# Patient Record
Sex: Female | Born: 1950 | Hispanic: No | Marital: Married | Smoking: Current every day smoker
Health system: Southern US, Community
[De-identification: ages and names within clinical notes are randomized; demographics above are authoritative.]

## PROBLEM LIST (undated history)

## (undated) DIAGNOSIS — F32A Depression, unspecified: Secondary | ICD-10-CM

## (undated) DIAGNOSIS — J42 Unspecified chronic bronchitis: Secondary | ICD-10-CM

## (undated) DIAGNOSIS — M1990 Unspecified osteoarthritis, unspecified site: Secondary | ICD-10-CM

## (undated) DIAGNOSIS — T4145XA Adverse effect of unspecified anesthetic, initial encounter: Secondary | ICD-10-CM

## (undated) DIAGNOSIS — Z9981 Dependence on supplemental oxygen: Secondary | ICD-10-CM

## (undated) DIAGNOSIS — F329 Major depressive disorder, single episode, unspecified: Secondary | ICD-10-CM

## (undated) DIAGNOSIS — J449 Chronic obstructive pulmonary disease, unspecified: Secondary | ICD-10-CM

## (undated) DIAGNOSIS — T8859XA Other complications of anesthesia, initial encounter: Secondary | ICD-10-CM

## (undated) DIAGNOSIS — J189 Pneumonia, unspecified organism: Secondary | ICD-10-CM

## (undated) DIAGNOSIS — F419 Anxiety disorder, unspecified: Secondary | ICD-10-CM

## (undated) HISTORY — PX: AUGMENTATION MAMMAPLASTY: SUR837

## (undated) HISTORY — PX: TUBAL LIGATION: SHX77

## (undated) HISTORY — PX: JOINT REPLACEMENT: SHX530

## (undated) HISTORY — PX: TOTAL HIP ARTHROPLASTY: SHX124

## (undated) HISTORY — PX: CATARACT EXTRACTION W/ INTRAOCULAR LENS IMPLANT: SHX1309

---

## 2012-12-04 ENCOUNTER — Emergency Department (HOSPITAL_BASED_OUTPATIENT_CLINIC_OR_DEPARTMENT_OTHER): Payer: Self-pay

## 2012-12-04 ENCOUNTER — Inpatient Hospital Stay (HOSPITAL_BASED_OUTPATIENT_CLINIC_OR_DEPARTMENT_OTHER)
Admission: EM | Admit: 2012-12-04 | Discharge: 2012-12-08 | DRG: 193 | Disposition: A | Discharge status: Home / Self Care | Payer: MEDICAID | Attending: Internal Medicine | Admitting: Internal Medicine

## 2012-12-04 ENCOUNTER — Encounter (HOSPITAL_BASED_OUTPATIENT_CLINIC_OR_DEPARTMENT_OTHER): Payer: Self-pay

## 2012-12-04 DIAGNOSIS — IMO0002 Reserved for concepts with insufficient information to code with codable children: Secondary | ICD-10-CM

## 2012-12-04 DIAGNOSIS — J189 Pneumonia, unspecified organism: Principal | ICD-10-CM

## 2012-12-04 DIAGNOSIS — B954 Other streptococcus as the cause of diseases classified elsewhere: Secondary | ICD-10-CM | POA: Diagnosis present

## 2012-12-04 DIAGNOSIS — E43 Unspecified severe protein-calorie malnutrition: Secondary | ICD-10-CM

## 2012-12-04 DIAGNOSIS — Z720 Tobacco use: Secondary | ICD-10-CM

## 2012-12-04 DIAGNOSIS — J449 Chronic obstructive pulmonary disease, unspecified: Secondary | ICD-10-CM

## 2012-12-04 DIAGNOSIS — F410 Panic disorder [episodic paroxysmal anxiety] without agoraphobia: Secondary | ICD-10-CM

## 2012-12-04 DIAGNOSIS — J4489 Other specified chronic obstructive pulmonary disease: Secondary | ICD-10-CM | POA: Diagnosis present

## 2012-12-04 DIAGNOSIS — F329 Major depressive disorder, single episode, unspecified: Secondary | ICD-10-CM | POA: Diagnosis present

## 2012-12-04 DIAGNOSIS — Z91041 Radiographic dye allergy status: Secondary | ICD-10-CM

## 2012-12-04 DIAGNOSIS — M129 Arthropathy, unspecified: Secondary | ICD-10-CM | POA: Diagnosis present

## 2012-12-04 DIAGNOSIS — F3289 Other specified depressive episodes: Secondary | ICD-10-CM | POA: Diagnosis present

## 2012-12-04 DIAGNOSIS — Z801 Family history of malignant neoplasm of trachea, bronchus and lung: Secondary | ICD-10-CM

## 2012-12-04 DIAGNOSIS — R0902 Hypoxemia: Secondary | ICD-10-CM

## 2012-12-04 DIAGNOSIS — Z681 Body mass index (BMI) 19 or less, adult: Secondary | ICD-10-CM

## 2012-12-04 DIAGNOSIS — F17200 Nicotine dependence, unspecified, uncomplicated: Secondary | ICD-10-CM | POA: Diagnosis present

## 2012-12-04 DIAGNOSIS — F411 Generalized anxiety disorder: Secondary | ICD-10-CM | POA: Diagnosis present

## 2012-12-04 DIAGNOSIS — R7881 Bacteremia: Secondary | ICD-10-CM

## 2012-12-04 DIAGNOSIS — F319 Bipolar disorder, unspecified: Secondary | ICD-10-CM

## 2012-12-04 HISTORY — DX: Chronic obstructive pulmonary disease, unspecified: J44.9

## 2012-12-04 HISTORY — DX: Anxiety disorder, unspecified: F41.9

## 2012-12-04 HISTORY — DX: Depression, unspecified: F32.A

## 2012-12-04 HISTORY — DX: Unspecified osteoarthritis, unspecified site: M19.90

## 2012-12-04 HISTORY — DX: Major depressive disorder, single episode, unspecified: F32.9

## 2012-12-04 LAB — CBC
HCT: 45 % (ref 36.0–46.0)
HCT: 42.1 % (ref 36.0–46.0)
Hemoglobin: 14.3 g/dL (ref 12.0–15.0)
MCH: 32.1 pg (ref 26.0–34.0)
MCHC: 33.1 g/dL (ref 30.0–36.0)
MCHC: 34 g/dL (ref 30.0–36.0)
MCV: 94.6 fL (ref 78.0–100.0)
Platelets: 186 10*3/uL (ref 150–400)
Platelets: 186 K/uL (ref 150–400)
RBC: 4.45 MIL/uL (ref 3.87–5.11)
RDW: 12.2 % (ref 11.5–15.5)
RDW: 12.6 % (ref 11.5–15.5)
WBC: 13.5 K/uL — ABNORMAL HIGH (ref 4.0–10.5)

## 2012-12-04 LAB — CREATININE, SERUM
Creatinine, Ser: 0.64 mg/dL (ref 0.50–1.10)
GFR calc Af Amer: >90 mL/min (ref >90)
GFR calc non Af Amer: >90 mL/min (ref >90)

## 2012-12-04 LAB — BASIC METABOLIC PANEL
BUN: 11 mg/dL (ref 6–23)
Creatinine, Ser: 0.7 mg/dL (ref 0.50–1.10)
GFR calc Af Amer: >90 mL/min (ref >90)
GFR calc non Af Amer: >90 mL/min (ref >90)
Potassium: 3.6 mEq/L (ref 3.5–5.1)

## 2012-12-04 LAB — TROPONIN I: Troponin I: <0.3 ng/mL (ref <0.30)

## 2012-12-04 MED ORDER — SODIUM CHLORIDE 0.9 % IV BOLUS (SEPSIS)
500.0000 mL | Freq: Once | INTRAVENOUS | Status: AC
  Administered 2012-12-04: 500 mL via INTRAVENOUS

## 2012-12-04 MED ORDER — OXYCODONE HCL 5 MG PO TABS
5.0000 mg | ORAL_TABLET | Freq: Four times a day (QID) | ORAL | Status: DC | PRN
  Administered 2012-12-04 – 2012-12-07 (×8): 5 mg via ORAL
  Filled 2012-12-04 (×8): qty 1

## 2012-12-04 MED ORDER — SODIUM CHLORIDE 0.9 % IJ SOLN
3.0000 mL | Freq: Two times a day (BID) | INTRAMUSCULAR | Status: DC
  Administered 2012-12-04 – 2012-12-07 (×7): 3 mL via INTRAVENOUS

## 2012-12-04 MED ORDER — SODIUM CHLORIDE 0.9 % IJ SOLN: 3.0000 mL | INTRAMUSCULAR | Status: DC | PRN

## 2012-12-04 MED ORDER — BUDESONIDE-FORMOTEROL FUMARATE 160-4.5 MCG/ACT IN AERO
2.0000 | INHALATION_SPRAY | Freq: Two times a day (BID) | RESPIRATORY_TRACT | Status: DC
  Administered 2012-12-04 – 2012-12-08 (×8): 2 via RESPIRATORY_TRACT
  Filled 2012-12-04: qty 6

## 2012-12-04 MED ORDER — ENOXAPARIN SODIUM 30 MG/0.3ML ~~LOC~~ SOLN
30.0000 mg | SUBCUTANEOUS | Status: DC
  Administered 2012-12-04 – 2012-12-07 (×4): 30 mg via SUBCUTANEOUS
  Filled 2012-12-04 (×5): qty 0.3

## 2012-12-04 MED ORDER — ASPIRIN 81 MG PO CHEW
324.0000 mg | CHEWABLE_TABLET | Freq: Once | ORAL | Status: AC
  Administered 2012-12-04: 324 mg via ORAL
  Filled 2012-12-04: qty 4

## 2012-12-04 MED ORDER — DEXTROSE 5 % IV SOLN
1.0000 g | INTRAVENOUS | Status: DC
  Administered 2012-12-05 – 2012-12-07 (×3): 1 g via INTRAVENOUS
  Filled 2012-12-04 (×4): qty 10

## 2012-12-04 MED ORDER — SODIUM CHLORIDE 0.9 % IV SOLN: 250.0000 mL | INTRAVENOUS | Status: DC | PRN

## 2012-12-04 MED ORDER — DEXTROSE 5 % IV SOLN
1.0000 g | Freq: Once | INTRAVENOUS | Status: AC
  Administered 2012-12-04: 1 g via INTRAVENOUS
  Filled 2012-12-04: qty 10

## 2012-12-04 MED ORDER — SODIUM CHLORIDE 0.9 % IJ SOLN
3.0000 mL | Freq: Two times a day (BID) | INTRAMUSCULAR | Status: DC
  Administered 2012-12-05: 3 mL via INTRAVENOUS

## 2012-12-04 MED ORDER — TIOTROPIUM BROMIDE MONOHYDRATE 18 MCG IN CAPS
18.0000 ug | ORAL_CAPSULE | Freq: Every day | RESPIRATORY_TRACT | Status: DC
  Administered 2012-12-05: 18 ug via RESPIRATORY_TRACT
  Filled 2012-12-04: qty 5

## 2012-12-04 MED ORDER — GUAIFENESIN 100 MG/5ML PO SYRP
200.0000 mg | ORAL_SOLUTION | ORAL | Status: DC | PRN
  Filled 2012-12-04: qty 10

## 2012-12-04 MED ORDER — LEVALBUTEROL HCL 0.63 MG/3ML IN NEBU
0.6300 mg | INHALATION_SOLUTION | RESPIRATORY_TRACT | Status: DC | PRN
  Filled 2012-12-04: qty 3

## 2012-12-04 MED ORDER — CLONAZEPAM 0.5 MG PO TABS
0.2500 mg | ORAL_TABLET | Freq: Every day | ORAL | Status: DC | PRN
  Administered 2012-12-04: 0.25 mg via ORAL
  Filled 2012-12-04: qty 1

## 2012-12-04 MED ORDER — AZITHROMYCIN 500 MG PO TABS
500.0000 mg | ORAL_TABLET | Freq: Every day | ORAL | Status: DC
  Administered 2012-12-05 – 2012-12-07 (×3): 500 mg via ORAL
  Filled 2012-12-04 (×3): qty 1

## 2012-12-04 MED ORDER — ACETAMINOPHEN 650 MG RE SUPP: 650.0000 mg | Freq: Four times a day (QID) | RECTAL | Status: DC | PRN

## 2012-12-04 MED ORDER — ONDANSETRON HCL 4 MG PO TABS
4.0000 mg | ORAL_TABLET | Freq: Four times a day (QID) | ORAL | Status: DC | PRN
  Administered 2012-12-05 – 2012-12-07 (×6): 4 mg via ORAL
  Filled 2012-12-04 (×6): qty 1

## 2012-12-04 MED ORDER — AZITHROMYCIN 250 MG PO TABS
500.0000 mg | ORAL_TABLET | Freq: Once | ORAL | Status: AC
  Administered 2012-12-04: 500 mg via ORAL
  Filled 2012-12-04: qty 2

## 2012-12-04 MED ORDER — ACETAMINOPHEN 325 MG PO TABS
650.0000 mg | ORAL_TABLET | Freq: Four times a day (QID) | ORAL | Status: DC | PRN
  Administered 2012-12-08: 650 mg via ORAL
  Filled 2012-12-04 (×2): qty 2

## 2012-12-04 MED ORDER — ONDANSETRON HCL 4 MG/2ML IJ SOLN
4.0000 mg | Freq: Four times a day (QID) | INTRAMUSCULAR | Status: DC | PRN
  Administered 2012-12-05 (×2): 4 mg via INTRAVENOUS
  Filled 2012-12-04 (×2): qty 2

## 2012-12-04 MED ORDER — GUAIFENESIN ER 600 MG PO TB12
600.0000 mg | ORAL_TABLET | Freq: Two times a day (BID) | ORAL | Status: DC
  Administered 2012-12-04 – 2012-12-05 (×2): 600 mg via ORAL
  Filled 2012-12-04 (×3): qty 1

## 2012-12-04 MED ORDER — ENOXAPARIN SODIUM 40 MG/0.4ML ~~LOC~~ SOLN: 40.0000 mg | SUBCUTANEOUS | Status: DC

## 2012-12-04 MED ORDER — ASPIRIN EC 81 MG PO TBEC
81.0000 mg | DELAYED_RELEASE_TABLET | Freq: Every day | ORAL | Status: DC
  Administered 2012-12-04 – 2012-12-08 (×5): 81 mg via ORAL
  Filled 2012-12-04 (×5): qty 1

## 2012-12-04 MED ORDER — IPRATROPIUM BROMIDE 0.02 % IN SOLN
0.5000 mg | Freq: Once | RESPIRATORY_TRACT | Status: AC
  Administered 2012-12-04: 0.5 mg via RESPIRATORY_TRACT
  Filled 2012-12-04: qty 2.5

## 2012-12-04 MED ORDER — MORPHINE SULFATE 4 MG/ML IJ SOLN
4.0000 mg | Freq: Once | INTRAMUSCULAR | Status: AC
  Administered 2012-12-04: 4 mg via INTRAVENOUS
  Filled 2012-12-04: qty 1

## 2012-12-04 MED ORDER — ALBUTEROL SULFATE (5 MG/ML) 0.5% IN NEBU
5.0000 mg | INHALATION_SOLUTION | Freq: Once | RESPIRATORY_TRACT | Status: AC
  Administered 2012-12-04: 5 mg via RESPIRATORY_TRACT
  Filled 2012-12-04: qty 0.5

## 2012-12-04 NOTE — Progress Notes (Signed)
NURSING PROGRESS NOTE  CARLETTA FEASEL [id number] Admission Data: 12/04/2012 3:25 PM Attending Provider: Catarina Hartshorn, MD PCP:No primary provider on file. Code Status: full    Erin Lozano is a 62 y.o. female patient admitted from ED:  -No acute distress noted.  -No complaints of shortness of breath.  -No complaints of chest pain.    Blood pressure 103/61, pulse 83, temperature 98.6 F (37 C), temperature source Oral, resp. rate 20, height 5' (1.524 m), weight 39.463 kg (87 lb), SpO2 99.00%.   IV Fluids:  IV in place, occlusive dsg intact without redness, IV cath antecubital right, condition patent and no redness none.   Allergies:  Contrast media  Past Medical History:   has a past medical history of COPD (chronic obstructive pulmonary disease); Arthritis; Anxiety; and Depression.  Past Surgical History:   has past surgical history that includes Breast enhancement surgery.  Social History:   reports that she has been smoking Cigarettes.  She has been smoking about 0.50 packs per day. She does not have any smokeless tobacco history on file. She reports that she does not drink alcohol or use illicit drugs.  Skin: intact  Patient/Family orientated to room. Information packet given to patient/family. Admission inpatient armband information verified with patient/family to include name and date of birth and placed on patient arm. Side rails up x 2, fall assessment and education completed with patient/family. Patient/family able to verbalize understanding of risk associated with falls and verbalized understanding to call for assistance before getting out of bed. Call light within reach. Patient/family able to voice and demonstrate understanding of unit orientation instructions.    Will continue to evaluate and treat per MD orders.   Madelin Rear, MSN, RN, [employer]

## 2012-12-04 NOTE — H&P (Signed)
[facility] History and Physical  Erin Lozano UXL:[id number] DOB: April 26, 1950 DOA: 12/04/2012  Referring physician:  PCP: No primary provider on file.  Specialists:   Chief Complaint: Pleuritic chest pain  HPI: Erin Lozano is a 62 y.o. female with history of COPD who presents with complaints of cough and pleuritic chest pain for the past 2 days. She says that she had had a cough productive of phlegm for about a week and it seemed to be improving, but 2 days ago she began having chest pain. She went to the [facility] regional ED and an EKG done>> according to her report it was negative, and she left without any further workup because she having to  wait for too long. This morning she began having chest pain again, described as pleuritic left precordial and associated with increasing shortness of breath. She denies nausea vomiting and no diaphoresis. She went to the [facility] ED and a chest x-ray showed acute infiltrates in the left upper lobe and chronic changes in the right upper lobe, d-dimer was elevated at 0.87, and white cell count 14.5. Blood cultures were obtained and she was started on empiric antibiotics and [facility]. Troponin done in ED was neg. patient denies fevers, dysuria diarrhea no melena.   Review of Systems: The patient denies anorexia, fever, weight loss,, vision loss, decreased hearing, hoarseness, syncope, peripheral edema, balance deficits, hemoptysis, abdominal pain, melena, hematochezia, severe indigestion/heartburn, hematuria, incontinence, genital sores, muscle weakness transient blindness, difficulty walking, depression, unusual weight change, abnormal bleeding, enlarged lymph nodes   Past Medical History  Diagnosis Date  . COPD (chronic obstructive pulmonary disease)   . Arthritis   . Anxiety   . Depression    Past Surgical History  Procedure Laterality Date  . Breast enhancement surgery     Social History:  reports that she has  been smoking Cigarettes.  She has been smoking about 0.50 packs per day. She does not have any smokeless tobacco history on file. She reports that she does not drink alcohol or use illicit drugs. where does patient live--home Can patient participate in ADLs- yes   Allergies  Allergen Reactions  . Contrast Media [Iodinated Diagnostic Agents]     FAMILY history: Father deceased- with lung cancer, her mother had peritoneal metastatic disease.  Prior to Admission medications   Medication Sig Start Date End Date Taking? Authorizing Provider  budesonide-formoterol (SYMBICORT) 160-4.5 MCG/ACT inhaler Inhale 2 puffs into the lungs 2 (two) times daily.   Yes Historical Provider, MD  ClonazePAM (KLONOPIN PO) Take by mouth.   Yes Historical Provider, MD  levalbuterol (XOPENEX) 0.31 MG/3ML nebulizer solution Take 1 ampule by nebulization every 4 (four) hours as needed for wheezing.   Yes Historical Provider, MD  tiotropium (SPIRIVA) 18 MCG inhalation capsule Place 18 mcg into inhaler and inhale daily.   Yes Historical Provider, MD   Physical Exam: Filed Vitals:   12/04/12 1406  BP: 103/61  Pulse:   Temp:   Resp:     Constitutional: Vital signs reviewed.  Patient is a well-developed and well-nourished in no acute distress and cooperative with exam. Alert and oriented x3.  Head: Normocephalic and atraumatic Nose: No erythema or drainage noted.   Mouth: no erythema or exudates, MMM Eyes: PERRL, EOMI, conjunctivae normal, No scleral icterus.  Neck: Supple, Trachea midline normal ROM, No JVD, mass, thyromegaly, or carotid bruit present.  Cardiovascular: RRR, S1 normal, S2 normal, no MRG, pulses symmetric and intact bilaterally  Pulmonary/Chest: normal respiratory effort,  rhonchi present anteriorly on the left greater than right. Poor air movement bilaterally posteriorly. no wheezes, no rales Abdominal: Soft. Non-tender, non-distended, bowel sounds are normal, no masses, organomegaly, or guarding  present.  Extremities: no cyanosis and no edema Neurological: A&O x3, Strength is normal and symmetric bilaterally, cranial nerve II-XII are grossly intact, no focal motor deficit, sensory intact to light touch bilaterally.  Skin: Warm, dry and intact. No rash, cyanosis, or clubbing.  Psychiatric: Normal mood and affect. speech and behavior is normal. Judgment and thought content normal. Cognition and memory are normal.     Labs on Admission:  Basic Metabolic Panel:  Recent Labs Lab 12/04/12 1110  NA 140  K 3.6  CL 101  CO2 31  GLUCOSE 104*  BUN 11  CREATININE 0.70  CALCIUM 9.2   Liver Function Tests: No results found for this basename: AST, ALT, ALKPHOS, BILITOT, PROT, ALBUMIN,  in the last 168 hours No results found for this basename: LIPASE, AMYLASE,  in the last 168 hours No results found for this basename: AMMONIA,  in the last 168 hours CBC:  Recent Labs Lab 12/04/12 1110  WBC 14.5*  HGB 14.9  HCT 45.0  MCV 95.5  PLT 186   Cardiac Enzymes:  Recent Labs Lab 12/04/12 1110  TROPONINI <0.30    BNP (last 3 results) No results found for this basename: PROBNP,  in the last 8760 hours CBG: No results found for this basename: GLUCAP,  in the last 168 hours  Radiological Exams on Admission: Dg Chest 2 View  12/04/2012   *RADIOLOGY REPORT*  Clinical Data: Cough and shortness of breath  CHEST - 2 VIEW  Comparison: 05/06/2012  Findings: Scarring is again noted in the right upper lobe.  Some acute infiltrate is noted in the left upper lobe laterally.  The lungs are hyperinflated.  Calcified breast implants are noted.  No bony abnormality is seen.  IMPRESSION: Chronic changes in the right upper lobe.  Acute infiltrate is noted in the left upper lobe.   Original Report Authenticated By: Alcide Clever, M.D.      Assessment/Plan Present on Admission:  .CAP -As discussed above will start empiric vancomycin, Rocephin -Add mucolytics/antitussives and follow -Follow up on  blood cultures obtained and the [facility] ED, and further treat as appropriate -D-dimer elevated, we'll obtain a VQ scan to further evaluate(she is allergic to IV contrast)  . COPD (chronic obstructive pulmonary disease) -Continue outpt medications and add when necessary nebs -She's not wheezing, Will hold off steroids at this time and follow     Code Status: full Family Communication: none at bedside Disposition Plan: Admit to [facility]  Time spent: >30MINS  Kela Millin [facility] Pager [phone]  If 7PM-7AM, please contact night-coverage [email or web address] Password [email or web address] 12/04/2012, 3:50 PM

## 2012-12-04 NOTE — Progress Notes (Signed)
Discussed with Dr. Criss Alvine about transfer from [facility]. 62 year old female with three-day history of chest pain, cough, shortness of breath noted to be hypoxic with oxygen saturation of 90% on room air. Workup revealed WBC 14.5 with chest x-ray showing acute left upper lobe infiltrate. D-dimer was mildly elevated at 0.87. The patient had blood pressure 97/75, and the patient was given 500 cc normal saline with improvement of blood pressure 10 111/55.  Cannot perform VQ scan at [facility].  Pt has allergy to contrast dye.  Admit to tele for V/Q scan tx for CAP.  Blood cultures done prior to ceftriaxone and azithro.  DTat

## 2012-12-04 NOTE — ED Notes (Signed)
O2 at 2LNC applied 

## 2012-12-04 NOTE — ED Notes (Signed)
Report called to Derryl Harbor, RN Unit RN.

## 2012-12-04 NOTE — ED Notes (Signed)
Warm blanket provided.

## 2012-12-04 NOTE — ED Notes (Signed)
SHOB improving and pt appears comfortable.

## 2012-12-04 NOTE — ED Notes (Signed)
Pt reports a 2 day history of chest pain, cough, and SHOB.

## 2012-12-04 NOTE — ED Provider Notes (Signed)
CSN: [id number]     Arrival date & time 12/04/12  1036 History     First MD Initiated Contact with Patient 12/04/12 1042     Chief Complaint  Patient presents with  . Cough  . Shortness of Breath  . Chest Pain   (Consider location/radiation/quality/duration/timing/severity/associated sxs/prior Treatment) Patient is a 62 y.o. female presenting with chest pain. The history is provided by the patient.  Chest Pain Pain location:  L chest Pain quality: sharp   Pain radiates to:  Does not radiate Pain radiates to the back: no   Pain severity:  Moderate Onset quality:  Sudden Duration:  3 days Timing:  Constant Progression:  Worsening Chronicity:  New Worsened by:  Deep breathing Associated symptoms: cough (clear sputum) and shortness of breath   Associated symptoms: no back pain, no fever, no lower extremity edema, no nausea and not vomiting     Past Medical History  Diagnosis Date  . COPD (chronic obstructive pulmonary disease)   . Arthritis   . Anxiety   . Depression    Past Surgical History  Procedure Laterality Date  . Breast enhancement surgery     No family history on file. History  Substance Use Topics  . Smoking status: Current Every Day Smoker -- 0.50 packs/day    Types: Cigarettes  . Smokeless tobacco: Not on file  . Alcohol Use: No   OB History   Grav Para Term Preterm Abortions TAB SAB Ect Mult Living                 Review of Systems  Constitutional: Negative for fever and chills.  Respiratory: Positive for cough (clear sputum) and shortness of breath.   Cardiovascular: Positive for chest pain.  Gastrointestinal: Negative for nausea and vomiting.  Musculoskeletal: Negative for back pain.  All other systems reviewed and are negative.    Allergies  Contrast media  Home Medications   Current Outpatient Rx  Name  Route  Sig  Dispense  Refill  . budesonide-formoterol (SYMBICORT) 160-4.5 MCG/ACT inhaler   Inhalation   Inhale 2 puffs into the  lungs 2 (two) times daily.         . ClonazePAM (KLONOPIN PO)   Oral   Take by mouth.         . levalbuterol (XOPENEX) 0.31 MG/3ML nebulizer solution   Nebulization   Take 1 ampule by nebulization every 4 (four) hours as needed for wheezing.         . tiotropium (SPIRIVA) 18 MCG inhalation capsule   Inhalation   Place 18 mcg into inhaler and inhale daily.          BP 97/75  Pulse 97  Temp(Src) 98.1 F (36.7 C) (Oral)  Resp 22  Ht 5' (1.524 m)  Wt 87 lb (39.463 kg)  BMI 16.99 kg/m2  SpO2 92% Physical Exam  Nursing note and vitals reviewed. Constitutional: She is oriented to person, place, and time. She appears well-developed and well-nourished.  HENT:  Head: Normocephalic and atraumatic.  Right Ear: External ear normal.  Left Ear: External ear normal.  Nose: Nose normal.  Eyes: Right eye exhibits no discharge. Left eye exhibits no discharge.  Cardiovascular: Normal rate, regular rhythm and normal heart sounds.   Pulmonary/Chest: Effort normal and breath sounds normal. She has no wheezes. She exhibits tenderness (left anterior and lateral).  Abdominal: Soft. There is no tenderness.  Neurological: She is alert and oriented to person, place, and time.  Skin: Skin is  warm and dry. No rash (no rash, esp over left lateral chest and back) noted.    ED Course   Procedures (including critical care time)  Labs Reviewed  CBC - Abnormal; Notable for the following:    WBC 14.5 (*)    All other components within normal limits  BASIC METABOLIC PANEL - Abnormal; Notable for the following:    Glucose, Bld 104 (*)    All other components within normal limits  D-DIMER, QUANTITATIVE - Abnormal; Notable for the following:    D-Dimer, Quant 0.87 (*)    All other components within normal limits  CULTURE, BLOOD (ROUTINE X 2)  CULTURE, BLOOD (ROUTINE X 2)  TROPONIN I  CG4 I-STAT (LACTIC ACID)    Date: 12/04/2012  Rate: 97  Rhythm: normal sinus rhythm  QRS Axis: right   Intervals: normal  ST/T Wave abnormalities: nonspecific ST/T changes  Conduction Disutrbances:none  Narrative Interpretation: NSR, signs of right atrial enlargement.  Old EKG Reviewed: none available   Dg Chest 2 View  12/04/2012   *RADIOLOGY REPORT*  Clinical Data: Cough and shortness of breath  CHEST - 2 VIEW  Comparison: 05/06/2012  Findings: Scarring is again noted in the right upper lobe.  Some acute infiltrate is noted in the left upper lobe laterally.  The lungs are hyperinflated.  Calcified breast implants are noted.  No bony abnormality is seen.  IMPRESSION: Chronic changes in the right upper lobe.  Acute infiltrate is noted in the left upper lobe.   Original Report Authenticated By: Alcide Clever, M.D.   1. Community acquired pneumonia     MDM  62 year old female with 3 days of chest pain and dyspnea. Both are worse on exertion. She has had a mildly productive cough but is not particularly new. Afebrile. No wheezing auscultated but due to mild increased work of breathing she was given a nebulizer it did not have any effect. X-ray consistent with a new infiltrate, we will treat her for her community acquired pneumonia. Given her borderline oxygenation at 90%, with mildly increased work of breathing and elevated white count we will admit to [facility] with the hospitalist.elevated d-dimer resulted after the x-ray, the pneumonia could be the cause of her pleuritic pain this is on the same side. However we are unable to evaluate further with CT due to patient being allergic to contrast and no VQ scan available at this facility.   Audree Camel, MD 12/04/12 1323

## 2012-12-04 NOTE — ED Notes (Signed)
Report called to Trey Paula, RN [facility]

## 2012-12-05 DIAGNOSIS — R7881 Bacteremia: Secondary | ICD-10-CM | POA: Diagnosis present

## 2012-12-05 LAB — CBC
Hemoglobin: 14.1 g/dL (ref 12.0–15.0)
MCHC: 32.3 g/dL (ref 30.0–36.0)

## 2012-12-05 LAB — STREP PNEUMONIAE URINARY ANTIGEN: Strep Pneumo Urinary Antigen: NEGATIVE

## 2012-12-05 LAB — TROPONIN I
Troponin I: <0.3 ng/mL (ref <0.30)
Troponin I: <0.3 ng/mL (ref <0.30)

## 2012-12-05 MED ORDER — ALBUTEROL SULFATE (5 MG/ML) 0.5% IN NEBU
2.5000 mg | INHALATION_SOLUTION | Freq: Three times a day (TID) | RESPIRATORY_TRACT | Status: DC
  Administered 2012-12-06 – 2012-12-08 (×7): 2.5 mg via RESPIRATORY_TRACT
  Filled 2012-12-05 (×7): qty 0.5

## 2012-12-05 MED ORDER — GUAIFENESIN ER 600 MG PO TB12
1200.0000 mg | ORAL_TABLET | Freq: Two times a day (BID) | ORAL | Status: DC
  Administered 2012-12-06 – 2012-12-07 (×2): 1200 mg via ORAL
  Filled 2012-12-05 (×6): qty 2

## 2012-12-05 MED ORDER — ALBUTEROL SULFATE (5 MG/ML) 0.5% IN NEBU: 2.5000 mg | INHALATION_SOLUTION | Freq: Four times a day (QID) | RESPIRATORY_TRACT | Status: DC

## 2012-12-05 MED ORDER — ALBUTEROL SULFATE (5 MG/ML) 0.5% IN NEBU
2.5000 mg | INHALATION_SOLUTION | Freq: Four times a day (QID) | RESPIRATORY_TRACT | Status: DC
  Administered 2012-12-05 (×2): 2.5 mg via RESPIRATORY_TRACT
  Filled 2012-12-05 (×2): qty 0.5

## 2012-12-05 MED ORDER — TROLAMINE SALICYLATE 10 % EX CREA
TOPICAL_CREAM | Freq: Two times a day (BID) | CUTANEOUS | Status: DC | PRN
  Filled 2012-12-05: qty 85

## 2012-12-05 MED ORDER — IPRATROPIUM BROMIDE 0.02 % IN SOLN: 0.5000 mg | Freq: Three times a day (TID) | RESPIRATORY_TRACT | Status: DC

## 2012-12-05 MED ORDER — IPRATROPIUM BROMIDE 0.02 % IN SOLN
0.5000 mg | Freq: Three times a day (TID) | RESPIRATORY_TRACT | Status: DC
  Administered 2012-12-06 – 2012-12-08 (×7): 0.5 mg via RESPIRATORY_TRACT
  Filled 2012-12-05 (×6): qty 2.5

## 2012-12-05 MED ORDER — IPRATROPIUM BROMIDE 0.02 % IN SOLN
0.5000 mg | Freq: Four times a day (QID) | RESPIRATORY_TRACT | Status: DC
  Administered 2012-12-05 (×2): 0.5 mg via RESPIRATORY_TRACT
  Filled 2012-12-05 (×4): qty 2.5

## 2012-12-05 MED ORDER — HYDROCOD POLST-CHLORPHEN POLST 10-8 MG/5ML PO LQCR
5.0000 mL | Freq: Every evening | ORAL | Status: DC | PRN
  Administered 2012-12-06 – 2012-12-07 (×2): 5 mL via ORAL
  Filled 2012-12-05 (×2): qty 5

## 2012-12-05 MED ORDER — MUSCLE RUB 10-15 % EX CREA
TOPICAL_CREAM | Freq: Two times a day (BID) | CUTANEOUS | Status: DC | PRN
  Administered 2012-12-06: 18:00:00 via TOPICAL
  Filled 2012-12-05: qty 85

## 2012-12-05 NOTE — Progress Notes (Signed)
[facility] PROGRESS NOTE  VIENNA FOLDEN GMW:[id number] DOB: [birth year] DOA: 12/04/2012 PCP: No primary provider on file. Pulmonologist:  Lysle Pearl, PA-C at [facility]   Assessment/Plan:  Community Acquired Pneumonia Leukocytosis increasing 8/21 (not on steroids) Started on Azith and Rocephin at admission Mucolytics, nebulizers. Paroxysmal non productive cough Gram + organism in 1 or 2 blood cultures will follow for sensitivites Strep pneumo antigen is negatve V/Q was ordered for elevated D-Dimer, but subsequently cancelled.  Possible Bacteremia Gram + organism in 1 or 2 blood cultures will follow for sensitivites  COPD Patient not on oxygen at home but is on 2L currently Will wean oxygen. Symbicort and Spiriva continued. Added scheduled nebs temporarily as patient is not moving air well Flutter valve.  DVT Prophylaxis:  Lovenox  Code Status: full Family Communication:  Disposition Plan: to home when able.   Antibiotics:  Rocephin / Azith  HPI/Subjective: Describes 4 deaths in the last 12 months in her family.  States she feels better than she did at the time of admission.  Objective: Filed Vitals:   12/04/12 1352 12/04/12 1406 12/04/12 2107 12/05/12 0532  BP: 94/48 103/61 116/70 119/67  Pulse: 83  100 87  Temp: 98.6 F (37 C)  98.2 F (36.8 C) 98.3 F (36.8 C)  TempSrc: Oral  Oral Oral  Resp: 20  18 18   Height:      Weight:      SpO2: 99%  98% 98%    Intake/Output Summary (Last 24 hours) at 12/05/12 1229 Last data filed at 12/05/12 1038  Gross per 24 hour  Intake    720 ml  Output    100 ml  Net    620 ml   Filed Weights   12/04/12 1053  Weight: 39.463 kg (87 lb)    Exam:   General:  thin, A&O, talkative, Has occasional coughing spell.  Cardiovascular: RRR, no murmurs, rubs or gallops, no lower extremity edema  Respiratory: Decreased breath sounds.  No wheeze, no accessory muscle movment.  Abdomen: thin, Soft,  non-tender, non-distended, + bowel sounds, no masses  Musculoskeletal: Able to move all 4 extremities, 5/5 strength in each  Data Reviewed: Basic Metabolic Panel:  Recent Labs Lab 12/04/12 1110 12/04/12 1821  NA 140  --   K 3.6  --   CL 101  --   CO2 31  --   GLUCOSE 104*  --   BUN 11  --   CREATININE 0.70 0.64  CALCIUM 9.2  --    CBC:  Recent Labs Lab 12/04/12 1110 12/04/12 1821 12/05/12 0526  WBC 14.5* 13.5* 16.2*  HGB 14.9 14.3 14.1  HCT 45.0 42.1 43.6  MCV 95.5 94.6 96.0  PLT 186 186 186   Cardiac Enzymes:  Recent Labs Lab 12/04/12 1110 12/04/12 1821 12/05/12 0004 12/05/12 0526  TROPONINI <0.30 <0.30 <0.30 <0.30     Recent Results (from the past 240 hour(s))  CULTURE, BLOOD (ROUTINE X 2)     Status: None   Collection Time    12/04/12 12:40 PM      Result Value Range Status   Specimen Description BLOOD L AC   Final   Special Requests BOTTLES DRAWN AEROBIC AND ANAEROBIC 5CC   Final   Culture  Setup Time     Final   Value: 12/04/2012 14:08     Performed at [facility]   Culture     Final   Value: GRAM POSITIVE ORGANISM     Note:  Gram Stain Report Called to,Read Back By and Verified With: Steffanie Dunn @ [phone] 12/05/12 BY KRAWS     Performed at [facility]   Report Status PENDING   Incomplete  CULTURE, BLOOD (ROUTINE X 2)     Status: None   Collection Time    12/04/12 12:45 PM      Result Value Range Status   Specimen Description BLOOD R FOREARM   Final   Special Requests BOTTLES DRAWN AEROBIC AND ANAEROBIC 5CC   Final   Culture  Setup Time     Final   Value: 12/04/2012 14:08     Performed at [facility]   Culture     Final   Value:        BLOOD CULTURE RECEIVED NO GROWTH TO DATE CULTURE WILL BE HELD FOR 5 DAYS BEFORE ISSUING A FINAL NEGATIVE REPORT     Performed at [facility]   Report Status PENDING   Incomplete     Studies: Dg Chest 2 View  12/04/2012   *RADIOLOGY REPORT*  Clinical Data: Cough and  shortness of breath  CHEST - 2 VIEW  Comparison: 05/06/2012  Findings: Scarring is again noted in the right upper lobe.  Some acute infiltrate is noted in the left upper lobe laterally.  The lungs are hyperinflated.  Calcified breast implants are noted.  No bony abnormality is seen.  IMPRESSION: Chronic changes in the right upper lobe.  Acute infiltrate is noted in the left upper lobe.   Original Report Authenticated By: Alcide Clever, M.D.    Scheduled Meds: . ipratropium  0.5 mg Nebulization Q6H   And  . albuterol  2.5 mg Nebulization Q6H  . aspirin EC  81 mg Oral Daily  . azithromycin  500 mg Oral Daily  . budesonide-formoterol  2 puff Inhalation BID  . cefTRIAXone (ROCEPHIN)  IV  1 g Intravenous Q24H  . enoxaparin (LOVENOX) injection  30 mg Subcutaneous Q24H  . guaiFENesin  1,200 mg Oral BID  . sodium chloride  3 mL Intravenous Q12H  . sodium chloride  3 mL Intravenous Q12H  . tiotropium  18 mcg Inhalation Daily   Continuous Infusions:   Active Problems:   Community acquired pneumonia   COPD (chronic obstructive pulmonary disease)   Bacteremia    Conley Canal  [facility] Pager [phone]. If 7PM-7AM, please contact night-coverage at [email or web address], password [facility] 12/05/2012, 12:29 PM  LOS: 1 day   Attending note:  Patient interviewed and examined. Agree with above.  Clinically quite stable. Will wean oxygen. Increase activity. Continue current antibiotics.  Crista Curb, MD

## 2012-12-05 NOTE — Progress Notes (Signed)
Gram positive aerobic bottleCRITICAL VALUE ALERT  Critical value received:  Aerobic bottle growing gram postive  Date of notification:  12/05/2012  Time of notification:  9:35 AM   Critical value read back:yes  Nurse who received alert:  Raoul Pitch  MD notified (1st page):  sullivan  Time of first page:  9:36 AM   MD notified (2nd page):  Time of second page:  Responding MD:  Lendell Caprice  Time MD responded:

## 2012-12-05 NOTE — Progress Notes (Signed)
SATURATION QUALIFICATIONS: (This note is used to comply with regulatory documentation for home oxygen)  Patient Saturations on Room Air at Rest = 80%  Patient Saturations on Room Air while Ambulating = 83%  Patient Saturations on 3 Liters of oxygen while Ambulating = 88%  Please briefly explain why patient needs home oxygen: Destats when before ambulating

## 2012-12-05 NOTE — Care Management Note (Unsigned)
    Page 1 of 1   12/05/2012     11:33:30 AM   CARE MANAGEMENT NOTE 12/05/2012  Patient:  Erin Lozano, Erin Lozano   Account Number:  [id number]  Date Initiated:  12/05/2012  Documentation initiated by:  Letha Cape  Subjective/Objective Assessment:   dx pna  admit- loives with family.     Action/Plan:   Anticipated DC Date:  12/06/2012   Anticipated DC Plan:  HOME/SELF CARE      DC Planning Services  CM consult  Follow-up appt scheduled  [facility]      Choice offered to / List presented to:             Status of service:  In process, will continue to follow Medicare Important Message given?   (If response is "NO", the following Medicare IM given date fields will be blank) Date Medicare IM given:   Date Additional Medicare IM given:    Discharge Disposition:    Per UR Regulation:  Reviewed for med. necessity/level of care/duration of stay  If discussed at Long Length of Stay Meetings, dates discussed:    Comments:  12/05/12 11:04 Letha Cape RN, BSN [phone] patient lives with family, pta indep.  Called financial counselor and left vm for her to see patient regarding no insurance and getting assess to Health Care Act information.  Patient scheduled for hosp f/u apt and orange card apt at [facility].  NCM will continue to follow for dc needs.

## 2012-12-06 DIAGNOSIS — E43 Unspecified severe protein-calorie malnutrition: Secondary | ICD-10-CM | POA: Insufficient documentation

## 2012-12-06 LAB — LEGIONELLA ANTIGEN, URINE: Legionella Antigen, Urine: NEGATIVE

## 2012-12-06 LAB — CBC
HCT: 39.9 % (ref 36.0–46.0)
Hemoglobin: 13.1 g/dL (ref 12.0–15.0)
MCH: 31.2 pg (ref 26.0–34.0)
MCHC: 32.8 g/dL (ref 30.0–36.0)
MCV: 95 fL (ref 78.0–100.0)
RDW: 12.4 % (ref 11.5–15.5)

## 2012-12-06 MED ORDER — NYSTATIN 100000 UNIT/ML MT SUSP
5.0000 mL | Freq: Four times a day (QID) | OROMUCOSAL | Status: DC
  Administered 2012-12-06 – 2012-12-08 (×7): 500000 [IU] via ORAL
  Filled 2012-12-06 (×12): qty 5

## 2012-12-06 MED ORDER — ENSURE COMPLETE PO LIQD
237.0000 mL | Freq: Two times a day (BID) | ORAL | Status: DC
  Administered 2012-12-06 – 2012-12-07 (×2): 237 mL via ORAL

## 2012-12-06 NOTE — Progress Notes (Signed)
INITIAL NUTRITION ASSESSMENT  DOCUMENTATION CODES Per approved criteria  -Severe malnutrition in the context of acute illness or injury -Underweight   INTERVENTION:  Ensure Complete twice daily (350 kcals, 13 gm protein per 8 fl oz bottle) RD to follow for nutrition care plan  NUTRITION DIAGNOSIS: Inadequate oral intake related to decreased appetite as evidenced by PO intake 30-50%  Goal: Pt to meet >/= 90% of their estimated nutrition needs   Monitor:  PO & supplemental intake, weight, labs, I/O's  Reason for Assessment: Malnutrition Screening Tool Report  62 y.o. female  Admitting Dx: pleuritic chest pain  ASSESSMENT: Patient with history of COPD who presented with complaints of cough and pleuritic chest pain for the past 2 days; chest X-ray showed acute infiltrates in the left upper lobe and chronic changes in the right upper lobe.  Patient reports she did not eat breakfast this AM due to nausea; states she was not eating much for the past 2 weeks (no meals just snack foods); also reports an approximate 15 lb weight loss (13% per records) ---> severe for time frame; RD recommended Ensure supplements and patient was agreeable ---> RD to order.  Patient meets criteria for severe malnutrition in the context of acute illness or injury as evidenced by < 50% intake of estimated energy requirement for > 5 days and 13% weight loss in < 1 month.  Height: Ht Readings from Last 1 Encounters:  12/04/12 5' (1.524 m)    Weight: Wt Readings from Last 1 Encounters:  12/04/12 87 lb (39.463 kg)    Ideal Body Weight: 100 lb  % Ideal Body Weight: 87%  Wt Readings from Last 10 Encounters:  12/04/12 87 lb (39.463 kg)    Usual Body Weight: 100 lb  % Usual Body Weight: 87%  BMI:  Body mass index is 16.99 kg/(m^2).  Estimated Nutritional Needs: Kcal: 1200-1400 Protein: 50-60 gm Fluid: >/= 1.5 L  Skin: Intact  Diet Order: Cardiac  EDUCATION NEEDS: -No education needs  identified at this time   Intake/Output Summary (Last 24 hours) at 12/06/12 1031 Last data filed at 12/05/12 1900  Gross per 24 hour  Intake    290 ml  Output    100 ml  Net    190 ml    Labs:   Recent Labs Lab 12/04/12 1110 12/04/12 1821  NA 140  --   K 3.6  --   CL 101  --   CO2 31  --   BUN 11  --   CREATININE 0.70 0.64  CALCIUM 9.2  --   GLUCOSE 104*  --     Scheduled Meds: . albuterol  2.5 mg Nebulization TID   And  . ipratropium  0.5 mg Nebulization TID  . aspirin EC  81 mg Oral Daily  . azithromycin  500 mg Oral Daily  . budesonide-formoterol  2 puff Inhalation BID  . cefTRIAXone (ROCEPHIN)  IV  1 g Intravenous Q24H  . enoxaparin (LOVENOX) injection  30 mg Subcutaneous Q24H  . guaiFENesin  1,200 mg Oral BID  . nystatin  5 mL Oral QID  . sodium chloride  3 mL Intravenous Q12H  . sodium chloride  3 mL Intravenous Q12H    Continuous Infusions:   Past Medical History  Diagnosis Date  . COPD (chronic obstructive pulmonary disease)   . Arthritis   . Anxiety   . Depression     Past Surgical History  Procedure Laterality Date  . Breast enhancement surgery  Arthur Holms, RD, LDN Pager #: [phone] After-Hours Pager #: [phone]

## 2012-12-06 NOTE — Progress Notes (Signed)
Attempted to ambulate patient in hall and check RA ambulation sats, patient states does not feel up to at present, Berle Mull RN

## 2012-12-06 NOTE — Progress Notes (Signed)
[facility] PROGRESS NOTE  Erin Lozano:[id number] DOB: 12-14-1950 DOA: 12/04/2012 PCP: No primary provider on file. Pulmonologist:  Lysle Pearl, PA-C at [facility]   Assessment/Plan:  Community Acquired Pneumonia Leukocytosis has resolved. Started on Azith and Rocephin at admission.  Will continue these until blood cultures are finalized. Mucolytics, nebulizers. Paroxysmal non productive cough - now bringing up green sputum Gram + organism in 1 or 2 blood cultures will follow for sensitivites Strep pneumo antigen is negative. Legionella antigen is negative.  Possible Bacteremia Gram + organism in 1 or 2 blood cultures will follow for sensitivites  COPD Patient not on oxygen at home but is on 2L currently.  Oxygen sats are 83% ambulating on room air. Will wean oxygen as tolerated. Symbicort and Spiriva continued. Nebulizers. Flutter valve.  DVT Prophylaxis:  Lovenox  Code Status: full Family Communication:  Disposition Plan: to home when able.  Severe malnutrition Follow nutrition recommendations.   Antibiotics:  Rocephin / Azith  HPI/Subjective: Patient reports she is feeling better.  She tells me the room is permeated by her mother and dogs (who have all passed).  Objective: Filed Vitals:   12/05/12 2106 12/05/12 2121 12/06/12 0554 12/06/12 1001  BP: 98/60  113/69   Pulse: 88  82   Temp: 99 F (37.2 C)  98.9 F (37.2 C)   TempSrc: Oral  Oral   Resp: 20  18   Height:      Weight:      SpO2: 97% 98% 99% 96%    Intake/Output Summary (Last 24 hours) at 12/06/12 1347 Last data filed at 12/06/12 1055  Gross per 24 hour  Intake    293 ml  Output      0 ml  Net    293 ml   Filed Weights   12/04/12 1053  Weight: 39.463 kg (87 lb)    Exam:   General:  thin, A&O, talkative, remains on oxygen via n/c  Cardiovascular: RRR, no murmurs, rubs or gallops, no lower extremity edema  Respiratory: Decreased breath sounds.  No  wheeze, no accessory muscle movment.  Abdomen: thin, Soft, non-tender, non-distended, + bowel sounds, no masses  Musculoskeletal: Able to move all 4 extremities, 5/5 strength in each  Data Reviewed: Basic Metabolic Panel:  Recent Labs Lab 12/04/12 1110 12/04/12 1821  NA 140  --   K 3.6  --   CL 101  --   CO2 31  --   GLUCOSE 104*  --   BUN 11  --   CREATININE 0.70 0.64  CALCIUM 9.2  --    CBC:  Recent Labs Lab 12/04/12 1110 12/04/12 1821 12/05/12 0526 12/06/12 0650  WBC 14.5* 13.5* 16.2* 9.2  HGB 14.9 14.3 14.1 13.1  HCT 45.0 42.1 43.6 39.9  MCV 95.5 94.6 96.0 95.0  PLT 186 186 186 198   Cardiac Enzymes:  Recent Labs Lab 12/04/12 1110 12/04/12 1821 12/05/12 0004 12/05/12 0526  TROPONINI <0.30 <0.30 <0.30 <0.30     Recent Results (from the past 240 hour(s))  CULTURE, BLOOD (ROUTINE X 2)     Status: None   Collection Time    12/04/12 12:40 PM      Result Value Range Status   Specimen Description BLOOD L AC   Final   Special Requests BOTTLES DRAWN AEROBIC AND ANAEROBIC 5CC   Final   Culture  Setup Time     Final   Value: 12/04/2012 14:08     Performed at [facility]   Culture     Final   Value: GRAM POSITIVE ORGANISM     Note: Gram Stain Report Called to,Read Back By and Verified With: Steffanie Dunn @ [phone] 12/05/12 BY KRAWS     Performed at [facility]   Report Status PENDING   Incomplete  CULTURE, BLOOD (ROUTINE X 2)     Status: None   Collection Time    12/04/12 12:45 PM      Result Value Range Status   Specimen Description BLOOD R FOREARM   Final   Special Requests BOTTLES DRAWN AEROBIC AND ANAEROBIC 5CC   Final   Culture  Setup Time     Final   Value: 12/04/2012 14:08     Performed at [facility]   Culture     Final   Value:        BLOOD CULTURE RECEIVED NO GROWTH TO DATE CULTURE WILL BE HELD FOR 5 DAYS BEFORE ISSUING A FINAL NEGATIVE REPORT     Performed at [facility]   Report Status PENDING   Incomplete      Studies: No results found.  Scheduled Meds: . albuterol  2.5 mg Nebulization TID   And  . ipratropium  0.5 mg Nebulization TID  . aspirin EC  81 mg Oral Daily  . azithromycin  500 mg Oral Daily  . budesonide-formoterol  2 puff Inhalation BID  . cefTRIAXone (ROCEPHIN)  IV  1 g Intravenous Q24H  . enoxaparin (LOVENOX) injection  30 mg Subcutaneous Q24H  . feeding supplement  237 mL Oral BID BM  . guaiFENesin  1,200 mg Oral BID  . nystatin  5 mL Oral QID  . sodium chloride  3 mL Intravenous Q12H  . sodium chloride  3 mL Intravenous Q12H   Continuous Infusions:   Active Problems:   Community acquired pneumonia   COPD (chronic obstructive pulmonary disease)   Bacteremia   Protein-calorie malnutrition, severe    Conley Canal  [facility] Pager [phone]. If 7PM-7AM, please contact night-coverage at [email or web address], password [facility] 12/06/2012, 1:47 PM  LOS: 2 days    Attending note:  Patient interviewed and examined. Agree with above. Await blood culture results.  Crista Curb, MD

## 2012-12-07 LAB — CULTURE, BLOOD (ROUTINE X 2)

## 2012-12-07 MED ORDER — CITALOPRAM HYDROBROMIDE 10 MG PO TABS
10.0000 mg | ORAL_TABLET | Freq: Every day | ORAL | Status: DC
  Filled 2012-12-07: qty 1

## 2012-12-07 MED ORDER — CLONAZEPAM 1 MG PO TABS
1.0000 mg | ORAL_TABLET | Freq: Two times a day (BID) | ORAL | Status: DC
  Administered 2012-12-07 – 2012-12-08 (×3): 1 mg via ORAL
  Filled 2012-12-07 (×5): qty 1

## 2012-12-07 MED ORDER — QUETIAPINE FUMARATE 50 MG PO TABS
50.0000 mg | ORAL_TABLET | Freq: Every day | ORAL | Status: DC
  Administered 2012-12-07: 50 mg via ORAL
  Filled 2012-12-07 (×2): qty 1

## 2012-12-07 MED ORDER — AZITHROMYCIN 250 MG PO TABS
250.0000 mg | ORAL_TABLET | Freq: Every day | ORAL | Status: DC
  Administered 2012-12-08: 250 mg via ORAL
  Filled 2012-12-07: qty 1

## 2012-12-07 NOTE — Progress Notes (Signed)
[facility] PROGRESS NOTE  Erin Lozano ZOX:[id number] DOB: [birth year] DOA: 12/04/2012 PCP: No primary provider on file. Pulmonologist:  Lysle Pearl, PA-C at [facility]   Assessment/Plan:  Nausea: pt thinks it is because she has not had her klonopin as she takes at home 1 mg bid. D/c opiates. Could be abx related  Community Acquired Pneumonia Improved. Home in am   Bacteremia Strep salvitica: cont current  COPD May need home O2 Needs to quit smoking  Bipolar. Does not take celexa. Takes seroquel 50 qhs. meds adjusted  DVT Prophylaxis:  Lovenox  Code Status: full Family Communication:  Disposition Plan: to home when able.  Severe malnutrition Follow nutrition recommendations.   Antibiotics:  Rocephin / Azith  HPI/Subjective: Patient reports she is feeling better.  She tells me the room is permeated by her mother and dogs (who have all passed).  Objective: Filed Vitals:   12/06/12 1528 12/06/12 2057 12/07/12 0555 12/07/12 0830  BP:  118/70 94/55   Pulse:  89 76   Temp:  98.4 F (36.9 C) 98.6 F (37 C)   TempSrc:  Oral Oral   Resp:  20 18   Height:      Weight:      SpO2: 100% 98% 100% 98%    Intake/Output Summary (Last 24 hours) at 12/07/12 1434 Last data filed at 12/06/12 2016  Gross per 24 hour  Intake    120 ml  Output      0 ml  Net    120 ml   Filed Weights   12/04/12 1053  Weight: 39.463 kg (87 lb)    Exam:   General:  thin, A&O, talkative, remains on oxygen via n/c  Cardiovascular: RRR, no murmurs, rubs or gallops, no lower extremity edema  Respiratory: Decreased breath sounds.  No wheeze, no accessory muscle movment.    Data Reviewed: Basic Metabolic Panel:  Recent Labs Lab 12/04/12 1110 12/04/12 1821  NA 140  --   K 3.6  --   CL 101  --   CO2 31  --   GLUCOSE 104*  --   BUN 11  --   CREATININE 0.70 0.64  CALCIUM 9.2  --    CBC:  Recent Labs Lab 12/04/12 1110 12/04/12 1821 12/05/12 0526  12/06/12 0650  WBC 14.5* 13.5* 16.2* 9.2  HGB 14.9 14.3 14.1 13.1  HCT 45.0 42.1 43.6 39.9  MCV 95.5 94.6 96.0 95.0  PLT 186 186 186 198   Cardiac Enzymes:  Recent Labs Lab 12/04/12 1110 12/04/12 1821 12/05/12 0004 12/05/12 0526  TROPONINI <0.30 <0.30 <0.30 <0.30     Recent Results (from the past 240 hour(s))  CULTURE, BLOOD (ROUTINE X 2)     Status: None   Collection Time    12/04/12 12:40 PM      Result Value Range Status   Specimen Description BLOOD L AC   Final   Special Requests BOTTLES DRAWN AEROBIC AND ANAEROBIC 5CC   Final   Culture  Setup Time     Final   Value: 12/04/2012 14:08     Performed at [facility]   Culture     Final   Value: STREPTOCOCCUS SPECIES     Note: IDENTIFIED AS AS STREPTOCOCCUS SALIVARIUS     Note: Gram Stain Report Called to,Read Back By and Verified WithSteffanie Dunn @ [phone] 12/05/12 BY KRAWS     Performed at [facility]   Report Status 12/07/2012 FINAL   Final  CULTURE, BLOOD (ROUTINE X 2)     Status: None   Collection Time    12/04/12 12:45 PM      Result Value Range Status   Specimen Description BLOOD R FOREARM   Final   Special Requests BOTTLES DRAWN AEROBIC AND ANAEROBIC 5CC   Final   Culture  Setup Time     Final   Value: 12/04/2012 14:08     Performed at [facility]   Culture     Final   Value:        BLOOD CULTURE RECEIVED NO GROWTH TO DATE CULTURE WILL BE HELD FOR 5 DAYS BEFORE ISSUING A FINAL NEGATIVE REPORT     Performed at [facility]   Report Status PENDING   Incomplete     Studies: No results found.  Scheduled Meds: . albuterol  2.5 mg Nebulization TID   And  . ipratropium  0.5 mg Nebulization TID  . aspirin EC  81 mg Oral Daily  . azithromycin  500 mg Oral Daily  . budesonide-formoterol  2 puff Inhalation BID  . cefTRIAXone (ROCEPHIN)  IV  1 g Intravenous Q24H  . clonazePAM  1 mg Oral BID  . enoxaparin (LOVENOX) injection  30 mg Subcutaneous Q24H  . feeding supplement  237  mL Oral BID BM  . nystatin  5 mL Oral QID  . QUEtiapine  50 mg Oral QHS  . sodium chloride  3 mL Intravenous Q12H  . sodium chloride  3 mL Intravenous Q12H   Continuous Infusions:   Christiane Ha, MD  [facility] Pager [phone]. If 7PM-7AM, please contact night-coverage at [email or web address], password [facility] 12/07/2012, 2:34 PM  LOS: 3 days

## 2012-12-07 NOTE — Plan of Care (Signed)
Problem: Phase III Progression Outcomes Goal: O2 sats > or equal to 93% on room air Outcome: Not Progressing Patient still require O@ wean  Down to 2 L

## 2012-12-07 NOTE — Plan of Care (Signed)
Problem: Phase III Progression Outcomes Goal: Tolerating diet Outcome: Not Progressing Patient have been having an poor appetite and having c/o nausea

## 2012-12-07 NOTE — Plan of Care (Signed)
Problem: Phase II Progression Outcomes Goal: Progress activity as tolerated unless otherwise ordered Outcome: Progressing OOB to ambulate in hall today

## 2012-12-07 NOTE — Plan of Care (Signed)
Problem: Phase II Progression Outcomes Goal: Wean O2 if indicated Outcome: Progressing Wean down to 2L

## 2012-12-07 NOTE — Plan of Care (Signed)
Problem: Phase II Progression Outcomes Goal: Review culture results with MD if needed Outcome: Progressing Culture pending

## 2012-12-08 DIAGNOSIS — Z720 Tobacco use: Secondary | ICD-10-CM | POA: Diagnosis present

## 2012-12-08 DIAGNOSIS — F17200 Nicotine dependence, unspecified, uncomplicated: Secondary | ICD-10-CM

## 2012-12-08 DIAGNOSIS — R0902 Hypoxemia: Secondary | ICD-10-CM | POA: Diagnosis present

## 2012-12-08 DIAGNOSIS — F319 Bipolar disorder, unspecified: Secondary | ICD-10-CM | POA: Diagnosis present

## 2012-12-08 DIAGNOSIS — F410 Panic disorder [episodic paroxysmal anxiety] without agoraphobia: Secondary | ICD-10-CM | POA: Diagnosis present

## 2012-12-08 MED ORDER — CEFUROXIME AXETIL 500 MG PO TABS: 500.0000 mg | ORAL_TABLET | Freq: Two times a day (BID) | ORAL | Status: DC

## 2012-12-08 MED ORDER — QUETIAPINE FUMARATE 50 MG PO TABS: 50.0000 mg | ORAL_TABLET | Freq: Every day | ORAL | Status: DC

## 2012-12-08 NOTE — Progress Notes (Signed)
   CARE MANAGEMENT NOTE 12/08/2012  Patient:  Erin Lozano, Erin Lozano   Account Number:  [id number]  Date Initiated:  12/05/2012  Documentation initiated by:  Letha Cape  Subjective/Objective Assessment:   dx pna  admit- loives with family.     Action/Plan:   Anticipated DC Date:  12/06/2012   Anticipated DC Plan:  HOME/SELF CARE      DC Planning Services  CM consult  Follow-up appt scheduled  [facility]  [facility]      Choice offered to / List presented to:     DME arranged  [facility]      DME agency  [facility]        Status of service:  Completed, signed off Medicare Important Message given?   (If response is "NO", the following Medicare IM given date fields will be blank) Date Medicare IM given:   Date Additional Medicare IM given:    Discharge Disposition:  HOME/SELF CARE  Per UR Regulation:  Reviewed for med. necessity/level of care/duration of stay  If discussed at Long Length of Stay Meetings, dates discussed:    Comments:  12/08/2012 1400 NCM spoke to pt and explained to contact [facility] when she arrives home to deliver oxygen. Pt provided with MATCH and explained can only use once per year. Isidoro Donning RN CCM Case Mgmt phone [phone]  12/05/12 11:04 Letha Cape RN, BSN [phone] patient lives with family, pta indep.  Called financial counselor and left vm for her to see patient regarding no insurance and getting assess to Health Care Act information.  Patient scheduled for hosp f/u apt and orange card apt at [facility].  NCM will continue to follow for dc needs.

## 2012-12-08 NOTE — Progress Notes (Signed)
.  SATURATION QUALIFICATIONS: (This note is used to comply with regulatory documentation for home oxygen)  Patient Saturations on Room Air at Rest =82%  Patient Saturations on Room Air while Ambulating = Didn't assess  Patient Saturations on 2 Liters of oxygen while Ambulating = 91%  Please briefly explain why patient needs home oxygen:Didn't walk patient because patient on room air rest saturation was low. Patient recover on 2L to 94% resting in 2 minutes resting and while ambulating on 2l sats where as low as 91%

## 2012-12-08 NOTE — Discharge Summary (Signed)
Physician Discharge Summary  Erin Lozano:[id number] DOB: Aug 29, 1950 DOA: 12/04/2012  PCP: No primary provider on file.  Admit date: 12/04/2012 Discharge date: 12/08/2012  Time spent: greater than 30 minutes  Recommendations for Outpatient Follow-up:  1. Follow up CXR 4-6 weeks  Discharge Diagnoses:  Active Problems:   Community acquired pneumonia   COPD (chronic obstructive pulmonary disease)   Bacteremia   Hypoxia   Bipolar disorder, unspecified   Panic disorder   Tobacco abuse   Discharge Condition: stable  Filed Weights   12/04/12 1053  Weight: 39.463 kg (87 lb)    History of present illness:   Erin Lozano is a 62 y.o. female with history of COPD who presents with complaints of cough and pleuritic chest pain for the past 2 days. She says that she had had a cough productive of phlegm for about a week and it seemed to be improving, but 2 days ago she began having chest pain. She went to the [facility] regional ED and an EKG done>> according to her report it was negative, and she left without any further workup because she having to wait for too long. This morning she began having chest pain again, described as pleuritic left precordial and associated with increasing shortness of breath. She denies nausea vomiting and no diaphoresis. She went to the [facility] ED and a chest x-ray showed acute infiltrates in the left upper lobe and chronic changes in the right upper lobe, d-dimer was elevated at 0.87, and white cell count 14.5. Blood cultures were obtained and she was started on empiric antibiotics and [facility]. Troponin done in ED was neg. patient denies fevers, dysuria diarrhea no melena.   Hospital Course:  Started on oxygen, empiric antibiotics. Tobacco cessation counseling.  1/2 blood culture positive for strep salivarius.  Patient's symptoms improved, she was ambulating and feeling better, but she remained hypoxic at discharge at 82 % on room air.   Home oxygen and outpatient follow up has been arranged.  Discharge Exam: Filed Vitals:   12/08/12 0517  BP: 102/65  Pulse: 73  Temp: 97.9 F (36.6 C)  Resp: 20    General: comfortable Cardiovascular: RRR Respiratory: CTA  Discharge Instructions  Discharge Orders   Future Appointments Provider Department Dept Phone   12/19/2012 2:00 PM [facility] Covering Provider [place] [facility] Joan Flores [phone]   12/19/2012 3:00 PM [facility] Financial Counselor  [facility] [place] [phone]   Future Orders Complete By Expires   Diet general  As directed    Discharge instructions  As directed    Comments:     Quit smoking   Driving Restrictions  As directed    Comments:     No driving until cleared by your doctor       Medication List    STOP taking these medications       citalopram 10 MG tablet  Commonly known as:  CELEXA      TAKE these medications       albuterol 108 (90 BASE) MCG/ACT inhaler  Commonly known as:  PROVENTIL HFA;VENTOLIN HFA  Inhale 1 puff into the lungs every 6 (six) hours as needed for wheezing.     budesonide-formoterol 160-4.5 MCG/ACT inhaler  Commonly known as:  SYMBICORT  Inhale 2 puffs into the lungs 2 (two) times daily.     cefUROXime 500 MG tablet  Commonly known as:  CEFTIN  Take 1 tablet (500 mg total) by  mouth 2 (two) times daily.     clonazePAM 1 MG tablet  Commonly known as:  KLONOPIN  Take 1 mg by mouth 2 (two) times daily as needed for anxiety.     GOODYS EXTRA STRENGTH PO  Take 1 Package by mouth every 6 (six) hours as needed (headaches).     QUEtiapine 50 MG tablet  Commonly known as:  SEROQUEL  Take 1 tablet (50 mg total) by mouth at bedtime.     tiotropium 18 MCG inhalation capsule  Commonly known as:  SPIRIVA  Place 18 mcg into inhaler and inhale daily.     XOPENEX 0.31 MG/3ML nebulizer solution  Generic drug:  levalbuterol  Take 1 ampule by nebulization every 4 (four) hours as  needed for wheezing.       Allergies  Allergen Reactions  . Contrast Media [Iodinated Diagnostic Agents]        Follow-up Information   Follow up with [facility] On 12/19/2012. (2 pm ,bring photo id ,, $20 co pay,any medications for hospital f/u)    Contact information:   [address] [place] [address] [phone]      Follow up with [facility]     On 12/19/2012. (3 pm , for orange card, bring completed paperwork and other items listed on paperwork)    Contact information:   [address] [place] [address]        The results of significant diagnostics from this hospitalization (including imaging, microbiology, ancillary and laboratory) are listed below for reference.    Significant Diagnostic Studies: Dg Chest 2 View  12/04/2012   *RADIOLOGY REPORT*  Clinical Data: Cough and shortness of breath  CHEST - 2 VIEW  Comparison: 05/06/2012  Findings: Scarring is again noted in the right upper lobe.  Some acute infiltrate is noted in the left upper lobe laterally.  The lungs are hyperinflated.  Calcified breast implants are noted.  No bony abnormality is seen.  IMPRESSION: Chronic changes in the right upper lobe.  Acute infiltrate is noted in the left upper lobe.   Original Report Authenticated By: Alcide Clever, M.D.    Microbiology: Recent Results (from the past 240 hour(s))  CULTURE, BLOOD (ROUTINE X 2)     Status: None   Collection Time    12/04/12 12:40 PM      Result Value Range Status   Specimen Description BLOOD L AC   Final   Special Requests BOTTLES DRAWN AEROBIC AND ANAEROBIC 5CC   Final   Culture  Setup Time     Final   Value: 12/04/2012 14:08     Performed at [facility]   Culture     Final   Value: STREPTOCOCCUS SPECIES     Note: IDENTIFIED AS AS STREPTOCOCCUS SALIVARIUS     Note: Gram Stain Report Called to,Read Back By and Verified WithSteffanie Dunn @ [phone] 12/05/12 BY  KRAWS     Performed at [facility]   Report Status 12/07/2012 FINAL   Final  CULTURE, BLOOD (ROUTINE X 2)     Status: None   Collection Time    12/04/12 12:45 PM      Result Value Range Status   Specimen Description BLOOD R FOREARM   Final   Special Requests BOTTLES DRAWN AEROBIC AND ANAEROBIC 5CC   Final   Culture  Setup Time     Final   Value: 12/04/2012 14:08  Performed at [facility]     Final   Value:        BLOOD CULTURE RECEIVED NO GROWTH TO DATE CULTURE WILL BE HELD FOR 5 DAYS BEFORE ISSUING A FINAL NEGATIVE REPORT     Performed at [facility]   Report Status PENDING   Incomplete     Labs: Basic Metabolic Panel:  Recent Labs Lab 12/04/12 1110 12/04/12 1821  NA 140  --   K 3.6  --   CL 101  --   CO2 31  --   GLUCOSE 104*  --   BUN 11  --   CREATININE 0.70 0.64  CALCIUM 9.2  --    Liver Function Tests: No results found for this basename: AST, ALT, ALKPHOS, BILITOT, PROT, ALBUMIN,  in the last 168 hours No results found for this basename: LIPASE, AMYLASE,  in the last 168 hours No results found for this basename: AMMONIA,  in the last 168 hours CBC:  Recent Labs Lab 12/04/12 1110 12/04/12 1821 12/05/12 0526 12/06/12 0650  WBC 14.5* 13.5* 16.2* 9.2  HGB 14.9 14.3 14.1 13.1  HCT 45.0 42.1 43.6 39.9  MCV 95.5 94.6 96.0 95.0  PLT 186 186 186 198   Cardiac Enzymes:  Recent Labs Lab 12/04/12 1110 12/04/12 1821 12/05/12 0004 12/05/12 0526  TROPONINI <0.30 <0.30 <0.30 <0.30   BNP: BNP (last 3 results) No results found for this basename: PROBNP,  in the last 8760 hours CBG: No results found for this basename: GLUCAP,  in the last 168 hours     Signed:  Aubry Tucholski L  [facility] 12/08/2012, 8:53 AM

## 2012-12-10 LAB — CULTURE, BLOOD (ROUTINE X 2)

## 2012-12-19 ENCOUNTER — Inpatient Hospital Stay: Payer: Self-pay | Admitting: Internal Medicine

## 2012-12-19 ENCOUNTER — Ambulatory Visit: Payer: Self-pay

## 2015-03-13 ENCOUNTER — Emergency Department (HOSPITAL_BASED_OUTPATIENT_CLINIC_OR_DEPARTMENT_OTHER)
Admission: EM | Admit: 2015-03-13 | Discharge: 2015-03-13 | Disposition: A | Discharge status: Home / Self Care | Payer: Medicare Other | Attending: Emergency Medicine | Admitting: Emergency Medicine

## 2015-03-13 ENCOUNTER — Emergency Department (HOSPITAL_BASED_OUTPATIENT_CLINIC_OR_DEPARTMENT_OTHER): Payer: Medicare Other

## 2015-03-13 ENCOUNTER — Encounter (HOSPITAL_BASED_OUTPATIENT_CLINIC_OR_DEPARTMENT_OTHER): Payer: Self-pay | Admitting: Emergency Medicine

## 2015-03-13 DIAGNOSIS — W01198A Fall on same level from slipping, tripping and stumbling with subsequent striking against other object, initial encounter: Secondary | ICD-10-CM | POA: Diagnosis not present

## 2015-03-13 DIAGNOSIS — S6991XA Unspecified injury of right wrist, hand and finger(s), initial encounter: Secondary | ICD-10-CM | POA: Insufficient documentation

## 2015-03-13 DIAGNOSIS — F329 Major depressive disorder, single episode, unspecified: Secondary | ICD-10-CM | POA: Insufficient documentation

## 2015-03-13 DIAGNOSIS — S42211A Unspecified displaced fracture of surgical neck of right humerus, initial encounter for closed fracture: Secondary | ICD-10-CM | POA: Insufficient documentation

## 2015-03-13 DIAGNOSIS — J449 Chronic obstructive pulmonary disease, unspecified: Secondary | ICD-10-CM | POA: Diagnosis not present

## 2015-03-13 DIAGNOSIS — Z7982 Long term (current) use of aspirin: Secondary | ICD-10-CM | POA: Diagnosis not present

## 2015-03-13 DIAGNOSIS — Z79899 Other long term (current) drug therapy: Secondary | ICD-10-CM | POA: Insufficient documentation

## 2015-03-13 DIAGNOSIS — S59911A Unspecified injury of right forearm, initial encounter: Secondary | ICD-10-CM | POA: Diagnosis present

## 2015-03-13 DIAGNOSIS — Y998 Other external cause status: Secondary | ICD-10-CM | POA: Insufficient documentation

## 2015-03-13 DIAGNOSIS — F419 Anxiety disorder, unspecified: Secondary | ICD-10-CM | POA: Diagnosis not present

## 2015-03-13 DIAGNOSIS — F17210 Nicotine dependence, cigarettes, uncomplicated: Secondary | ICD-10-CM | POA: Insufficient documentation

## 2015-03-13 DIAGNOSIS — M1990 Unspecified osteoarthritis, unspecified site: Secondary | ICD-10-CM | POA: Diagnosis not present

## 2015-03-13 DIAGNOSIS — Z792 Long term (current) use of antibiotics: Secondary | ICD-10-CM | POA: Diagnosis not present

## 2015-03-13 DIAGNOSIS — Y9289 Other specified places as the place of occurrence of the external cause: Secondary | ICD-10-CM | POA: Diagnosis not present

## 2015-03-13 DIAGNOSIS — Y9389 Activity, other specified: Secondary | ICD-10-CM | POA: Diagnosis not present

## 2015-03-13 DIAGNOSIS — Z7951 Long term (current) use of inhaled steroids: Secondary | ICD-10-CM | POA: Insufficient documentation

## 2015-03-13 DIAGNOSIS — S299XXA Unspecified injury of thorax, initial encounter: Secondary | ICD-10-CM | POA: Insufficient documentation

## 2015-03-13 MED ORDER — HYDROMORPHONE HCL 1 MG/ML IJ SOLN
0.5000 mg | Freq: Once | INTRAMUSCULAR | Status: AC
Start: 2015-03-13 — End: 2015-03-13
  Administered 2015-03-13: 0.5 mg via INTRAMUSCULAR
  Filled 2015-03-13: qty 1

## 2015-03-13 MED ORDER — OXYCODONE-ACETAMINOPHEN 5-325 MG PO TABS
1.0000 | ORAL_TABLET | ORAL | Status: AC | PRN
Start: 2015-03-13 — End: ?

## 2015-03-13 MED ORDER — OXYCODONE-ACETAMINOPHEN 5-325 MG PO TABS
1.0000 | ORAL_TABLET | Freq: Once | ORAL | Status: AC
  Administered 2015-03-13: 1 via ORAL
  Filled 2015-03-13: qty 1

## 2015-03-13 NOTE — ED Provider Notes (Signed)
CSN: [id number]     Arrival date & time 03/13/15  1919 History  By signing my name below, I, Erin Lozano, attest that this documentation has been prepared under the direction and in the presence of Mirian Mo, MD. Electronically Signed: Budd Lozano, ED Scribe. 03/13/2015. 7:49 PM.     Chief Complaint  Patient presents with  . Fall  . Arm Pain   Patient is a 64 y.o. female presenting with fall and arm pain.  Fall This is a new problem. The current episode started 1 to 2 hours ago. Associated symptoms include chest pain (right-sided upper ribcage). She has tried nothing for the symptoms.  Arm Pain This is a new problem. The current episode started 1 to 2 hours ago. The problem occurs constantly. The problem has been gradually worsening. Associated symptoms include chest pain (right-sided upper ribcage). She has tried nothing for the symptoms.   HPI Comments: Erin Lozano is a 64 y.o. female who presents to the Emergency Department complaining of constant, aching right arm pain onset after a fall that occurred nearly 2 hours ago. Pt states she tripped, causing her to fall forward and land on her right facial area. She notes striking her right wrist when she tried to catch herself, as well as striking a cabinet with her right shoulder on the way down. She reports associated right shoulder and wrist pain, as well as right upper ribcage pain. Pt denies LOC.  Past Medical History  Diagnosis Date  . COPD (chronic obstructive pulmonary disease) (HCC)   . Arthritis   . Anxiety   . Depression    Past Surgical History  Procedure Laterality Date  . Breast enhancement surgery     History reviewed. No pertinent family history. Social History  Substance Use Topics  . Smoking status: Current Every Day Smoker -- 0.50 packs/day    Types: Cigarettes  . Smokeless tobacco: None  . Alcohol Use: No   OB History    No data available     Review of Systems  Cardiovascular: Positive for  chest pain (right-sided upper ribcage).  Musculoskeletal: Positive for myalgias and arthralgias.  Neurological: Negative for syncope.  All other systems reviewed and are negative.   Allergies  Contrast media  Home Medications   Prior to Admission medications   Medication Sig Start Date End Date Taking? Authorizing Provider  albuterol (PROVENTIL HFA;VENTOLIN HFA) 108 (90 BASE) MCG/ACT inhaler Inhale 1 puff into the lungs every 6 (six) hours as needed for wheezing.    Historical Provider, MD  Aspirin-Acetaminophen-Caffeine (GOODYS EXTRA STRENGTH PO) Take 1 Package by mouth every 6 (six) hours as needed (headaches).    Historical Provider, MD  budesonide-formoterol (SYMBICORT) 160-4.5 MCG/ACT inhaler Inhale 2 puffs into the lungs 2 (two) times daily.    Historical Provider, MD  cefUROXime (CEFTIN) 500 MG tablet Take 1 tablet (500 mg total) by mouth 2 (two) times daily. 12/08/12   Christiane Ha, MD  clonazePAM (KLONOPIN) 1 MG tablet Take 1 mg by mouth 2 (two) times daily as needed for anxiety.    Historical Provider, MD  levalbuterol (XOPENEX) 0.31 MG/3ML nebulizer solution Take 1 ampule by nebulization every 4 (four) hours as needed for wheezing.    Historical Provider, MD  oxyCODONE-acetaminophen (PERCOCET/ROXICET) 5-325 MG tablet Take 1-2 tablets by mouth every 4 (four) hours as needed. 03/13/15   Mirian Mo, MD  QUEtiapine (SEROQUEL) 50 MG tablet Take 1 tablet (50 mg total) by mouth at bedtime. 12/08/12  Christiane Ha, MD  tiotropium (SPIRIVA) 18 MCG inhalation capsule Place 18 mcg into inhaler and inhale daily.    Historical Provider, MD   BP 116/71 mmHg  Pulse 84  Temp(Src) 98.3 F (36.8 C) (Oral)  Resp 20  SpO2 99% Physical Exam  Constitutional: She is oriented to person, place, and time. She appears well-developed and well-nourished.  HENT:  Head: Normocephalic and atraumatic.  Right Ear: External ear normal.  Left Ear: External ear normal.  Eyes: Conjunctivae and  EOM are normal. Pupils are equal, round, and reactive to light.  Neck: Normal range of motion. Neck supple.  Cardiovascular: Normal rate, regular rhythm, normal heart sounds and intact distal pulses.   Pulses:      Radial pulses are 2+ on the right side, and 2+ on the left side.  Pulmonary/Chest: Effort normal and breath sounds normal.  Abdominal: Soft. Bowel sounds are normal. There is no tenderness.  Musculoskeletal: Normal range of motion.       Right elbow: Normal.      Right upper arm: She exhibits tenderness, bony tenderness and swelling.       Right forearm: Normal.  Neurological: She is alert and oriented to person, place, and time.  Skin: Skin is warm and dry.  Vitals reviewed.   ED Course  Procedures  DIAGNOSTIC STUDIES: Oxygen Saturation is 96% on RA, adequateby my interpretation.    COORDINATION OF CARE: 7:44 PM - Discussed plans to order diagnostic imaging and medication for pain. Pt advised of plan for treatment and pt agrees.  Labs Review Labs Reviewed - No data to display  Imaging Review Dg Ribs Unilateral W/chest Right  03/13/2015  CLINICAL DATA:  Trip and fall onto the right shoulder. Right upper rib pain. EXAM: RIGHT RIBS AND CHEST - 3+ VIEW COMPARISON:  09/07/2014 FINDINGS: Normal heart size and pulmonary vascularity. Emphysematous changes in the lungs. Linear scarring in both upper lungs, more prominent on the right. Mild pleural thickening. Appearance is unchanged since previous study. No focal consolidation. No pneumothorax. No pleural effusions. Bilateral breast implants with calcification. Right ribs appear intact. No acute displaced right rib fractures. Incidental note of fracture of the right humeral neck. IMPRESSION: Chronic changes in the lungs including emphysema and scarring with pleural thickening. No evidence of active pulmonary disease. Negative right ribs.  Right humeral neck fracture. Electronically Signed   By: Burman Nieves M.D.   On: 03/13/2015  21:05   Dg Shoulder Right  03/13/2015  CLINICAL DATA:  Trip and fall injury with pain and swelling over the right shoulder. Limited range of motion. EXAM: RIGHT SHOULDER - 2+ VIEW COMPARISON:  None. FINDINGS: Comminuted mostly transverse fracture of the surgical neck of the right humerus. Impaction of fracture fragments. No significant displacement or dislocation. IMPRESSION: Acute fracture of the surgical neck right humerus with impaction. No dislocation or displacement. Electronically Signed   By: Burman Nieves M.D.   On: 03/13/2015 21:02   I have personally reviewed and evaluated these images and lab results as part of my medical decision-making.   EKG Interpretation None      MDM   Final diagnoses:  Humeral surgical neck fracture, right, closed, initial encounter    64 y.o. female with pertinent PMH of COPD, anxiety, depression presents with R arm pain after mechanical fall.  No hit to head or LOC.  On arrival vitals and physical exam as above.  NV intact.  Some R chest wall pain.  No back pain.  Wu with comminuted humeral fracture.  Spoke with orthopedics who recommended outpt fu.  Sling applied.  DC home in stable condition.    I have reviewed all laboratory and imaging studies if ordered as above  1. Humeral surgical neck fracture, right, closed, initial encounter           Mirian MoMatthew Trenyce Loera, MD 03/13/15 2215

## 2015-03-13 NOTE — ED Notes (Signed)
Patient transported to X-ray 

## 2015-03-13 NOTE — Discharge Instructions (Signed)
Humerus Fracture Treated With Immobilization °The humerus is the large bone in your upper arm. You have a broken (fractured) humerus. These fractures are easily diagnosed with X-rays. °TREATMENT  °Simple fractures which will heal without disability are treated with simple immobilization. Immobilization means you will wear a cast, splint, or sling. You have a fracture which will do well with immobilization. The fracture will heal well simply by being held in a good position until it is stable enough to begin range of motion exercises. Do not take part in activities which would further injure your arm.  °HOME CARE INSTRUCTIONS  °· Put ice on the injured area. °¨ Put ice in a plastic bag. °¨ Place a towel between your skin and the bag. °¨ Leave the ice on for 15-20 minutes, 03-04 times a day. °· If you have a cast: °¨ Do not scratch the skin under the cast using sharp or pointed objects. °¨ Check the skin around the cast every day. You may put lotion on any red or sore areas. °¨ Keep your cast dry and clean. °· If you have a splint: °¨ Wear the splint as directed. °¨ Keep your splint dry and clean. °¨ You may loosen the elastic around the splint if your fingers become numb, tingle, or turn cold or blue. °· If you have a sling: °¨ Wear the sling as directed. °· Do not put pressure on any part of your cast or splint until it is fully hardened. °· Your cast or splint can be protected during bathing with a plastic bag. Do not lower the cast or splint into water. °· Only take over-the-counter or prescription medicines for pain, discomfort, or fever as directed by your caregiver. °· Do range of motion exercises as instructed by your caregiver. °· Follow up as directed by your caregiver. This is very important in order to avoid permanent injury or disability and chronic pain. °SEEK IMMEDIATE MEDICAL CARE IF:  °· Your skin or nails in the injured arm turn blue or gray. °· Your arm feels cold or numb. °· You develop severe pain  in the injured arm. °· You are having problems with the medicines you were given. °MAKE SURE YOU:  °· Understand these instructions. °· Will watch your condition. °· Will get help right away if you are not doing well or get worse. °  °This information is not intended to replace advice given to you by your health care provider. Make sure you discuss any questions you have with your health care provider. °  °Document Released: 07/10/2000 Document Revised: 04/24/2014 Document Reviewed: 08/26/2014 °Elsevier Interactive Patient Education ©2016 [employer] ° °

## 2015-03-13 NOTE — ED Notes (Signed)
Pt tripped and fell forward this evening, fell on R shoulder and hit R facial area. No bruising or lac to face. Significant swelling to R shoulder.

## 2015-05-04 ENCOUNTER — Inpatient Hospital Stay (HOSPITAL_BASED_OUTPATIENT_CLINIC_OR_DEPARTMENT_OTHER)
Admission: EM | Admit: 2015-05-04 | Discharge: 2015-05-06 | DRG: 092 | Disposition: A | Discharge status: Home Health | Payer: Medicare Other | Attending: Internal Medicine | Admitting: Internal Medicine

## 2015-05-04 ENCOUNTER — Emergency Department (HOSPITAL_BASED_OUTPATIENT_CLINIC_OR_DEPARTMENT_OTHER): Payer: Medicare Other

## 2015-05-04 ENCOUNTER — Encounter (HOSPITAL_BASED_OUTPATIENT_CLINIC_OR_DEPARTMENT_OTHER): Payer: Self-pay | Admitting: Emergency Medicine

## 2015-05-04 DIAGNOSIS — Z23 Encounter for immunization: Secondary | ICD-10-CM

## 2015-05-04 DIAGNOSIS — J438 Other emphysema: Secondary | ICD-10-CM

## 2015-05-04 DIAGNOSIS — T402X5A Adverse effect of other opioids, initial encounter: Secondary | ICD-10-CM | POA: Diagnosis present

## 2015-05-04 DIAGNOSIS — M940 Chondrocostal junction syndrome [Tietze]: Secondary | ICD-10-CM | POA: Diagnosis not present

## 2015-05-04 DIAGNOSIS — J209 Acute bronchitis, unspecified: Secondary | ICD-10-CM | POA: Diagnosis present

## 2015-05-04 DIAGNOSIS — T43215A Adverse effect of selective serotonin and norepinephrine reuptake inhibitors, initial encounter: Secondary | ICD-10-CM | POA: Diagnosis present

## 2015-05-04 DIAGNOSIS — G92 Toxic encephalopathy: Secondary | ICD-10-CM | POA: Diagnosis not present

## 2015-05-04 DIAGNOSIS — Z9981 Dependence on supplemental oxygen: Secondary | ICD-10-CM

## 2015-05-04 DIAGNOSIS — G9340 Encephalopathy, unspecified: Secondary | ICD-10-CM | POA: Diagnosis not present

## 2015-05-04 DIAGNOSIS — T426X5A Adverse effect of other antiepileptic and sedative-hypnotic drugs, initial encounter: Secondary | ICD-10-CM | POA: Diagnosis present

## 2015-05-04 DIAGNOSIS — J441 Chronic obstructive pulmonary disease with (acute) exacerbation: Secondary | ICD-10-CM | POA: Diagnosis present

## 2015-05-04 DIAGNOSIS — R42 Dizziness and giddiness: Secondary | ICD-10-CM

## 2015-05-04 DIAGNOSIS — M25551 Pain in right hip: Secondary | ICD-10-CM | POA: Diagnosis present

## 2015-05-04 DIAGNOSIS — F329 Major depressive disorder, single episode, unspecified: Secondary | ICD-10-CM | POA: Diagnosis present

## 2015-05-04 DIAGNOSIS — S42211D Unspecified displaced fracture of surgical neck of right humerus, subsequent encounter for fracture with routine healing: Secondary | ICD-10-CM

## 2015-05-04 DIAGNOSIS — R0689 Other abnormalities of breathing: Secondary | ICD-10-CM | POA: Diagnosis present

## 2015-05-04 DIAGNOSIS — Z79899 Other long term (current) drug therapy: Secondary | ICD-10-CM

## 2015-05-04 DIAGNOSIS — Z9882 Breast implant status: Secondary | ICD-10-CM

## 2015-05-04 DIAGNOSIS — M1990 Unspecified osteoarthritis, unspecified site: Secondary | ICD-10-CM | POA: Diagnosis present

## 2015-05-04 DIAGNOSIS — D649 Anemia, unspecified: Secondary | ICD-10-CM | POA: Diagnosis present

## 2015-05-04 DIAGNOSIS — J440 Chronic obstructive pulmonary disease with acute lower respiratory infection: Secondary | ICD-10-CM | POA: Diagnosis present

## 2015-05-04 DIAGNOSIS — F419 Anxiety disorder, unspecified: Secondary | ICD-10-CM | POA: Diagnosis present

## 2015-05-04 DIAGNOSIS — Z888 Allergy status to other drugs, medicaments and biological substances status: Secondary | ICD-10-CM

## 2015-05-04 DIAGNOSIS — Y92009 Unspecified place in unspecified non-institutional (private) residence as the place of occurrence of the external cause: Secondary | ICD-10-CM

## 2015-05-04 DIAGNOSIS — T424X5A Adverse effect of benzodiazepines, initial encounter: Secondary | ICD-10-CM | POA: Diagnosis present

## 2015-05-04 DIAGNOSIS — W19XXXA Unspecified fall, initial encounter: Secondary | ICD-10-CM

## 2015-05-04 DIAGNOSIS — Z9181 History of falling: Secondary | ICD-10-CM

## 2015-05-04 DIAGNOSIS — Z91041 Radiographic dye allergy status: Secondary | ICD-10-CM

## 2015-05-04 DIAGNOSIS — F17210 Nicotine dependence, cigarettes, uncomplicated: Secondary | ICD-10-CM | POA: Diagnosis present

## 2015-05-04 DIAGNOSIS — Z7951 Long term (current) use of inhaled steroids: Secondary | ICD-10-CM

## 2015-05-04 DIAGNOSIS — R55 Syncope and collapse: Secondary | ICD-10-CM | POA: Diagnosis present

## 2015-05-04 DIAGNOSIS — J449 Chronic obstructive pulmonary disease, unspecified: Secondary | ICD-10-CM | POA: Diagnosis present

## 2015-05-04 HISTORY — DX: Pneumonia, unspecified organism: J18.9

## 2015-05-04 HISTORY — DX: Other complications of anesthesia, initial encounter: T88.59XA

## 2015-05-04 HISTORY — DX: Adverse effect of unspecified anesthetic, initial encounter: T41.45XA

## 2015-05-04 HISTORY — DX: Unspecified chronic bronchitis: J42

## 2015-05-04 HISTORY — DX: Dependence on supplemental oxygen: Z99.81

## 2015-05-04 LAB — CBC WITH DIFFERENTIAL/PLATELET
BASOS ABS: 0 10*3/uL (ref 0.0–0.1)
Basophils Relative: 1 %
Eosinophils Absolute: 0.1 10*3/uL (ref 0.0–0.7)
Eosinophils Relative: 3 %
HEMATOCRIT: 34.3 % — AB (ref 36.0–46.0)
Hemoglobin: 10.6 g/dL — ABNORMAL LOW (ref 12.0–15.0)
LYMPHS ABS: 0.9 10*3/uL (ref 0.7–4.0)
LYMPHS PCT: 17 %
MCH: 29.7 pg (ref 26.0–34.0)
MCHC: 30.9 g/dL (ref 30.0–36.0)
MCV: 96.1 fL (ref 78.0–100.0)
MONO ABS: 0.5 10*3/uL (ref 0.1–1.0)
Monocytes Relative: 11 %
NEUTROS ABS: 3.4 10*3/uL (ref 1.7–7.7)
Neutrophils Relative %: 68 %
Platelets: 161 10*3/uL (ref 150–400)
RBC: 3.57 MIL/uL — AB (ref 3.87–5.11)
RDW: 11.2 % — ABNORMAL LOW (ref 11.5–15.5)
WBC: 5 10*3/uL (ref 4.0–10.5)

## 2015-05-04 LAB — BASIC METABOLIC PANEL
ANION GAP: 5 (ref 5–15)
BUN: 11 mg/dL (ref 6–20)
CO2: 37 mmol/L — AB (ref 22–32)
Calcium: 8.5 mg/dL — ABNORMAL LOW (ref 8.9–10.3)
Chloride: 100 mmol/L — ABNORMAL LOW (ref 101–111)
Creatinine, Ser: 0.49 mg/dL (ref 0.44–1.00)
GFR calc Af Amer: >60 mL/min (ref >60)
GFR calc non Af Amer: >60 mL/min (ref >60)
GLUCOSE: 102 mg/dL — AB (ref 65–99)
POTASSIUM: 3.5 mmol/L (ref 3.5–5.1)
Sodium: 142 mmol/L (ref 135–145)

## 2015-05-04 LAB — URINALYSIS, ROUTINE W REFLEX MICROSCOPIC
BILIRUBIN URINE: NEGATIVE
GLUCOSE, UA: NEGATIVE mg/dL
HGB URINE DIPSTICK: NEGATIVE
KETONES UR: NEGATIVE mg/dL
LEUKOCYTES UA: NEGATIVE
Nitrite: NEGATIVE
PH: 7 (ref 5.0–8.0)
PROTEIN: NEGATIVE mg/dL
SPECIFIC GRAVITY, URINE: 1.019 (ref 1.005–1.030)

## 2015-05-04 LAB — I-STAT ARTERIAL BLOOD GAS, ED
Acid-Base Excess: 10 mmol/L — ABNORMAL HIGH (ref 0.0–2.0)
BICARBONATE: 37.7 meq/L — AB (ref 20.0–24.0)
O2 SAT: 98 %
PH ART: 7.379 (ref 7.350–7.450)
TCO2: 40 mmol/L (ref 0–100)
pCO2 arterial: 63.7 mmHg (ref 35.0–45.0)
pO2, Arterial: 109 mmHg — ABNORMAL HIGH (ref 80.0–100.0)

## 2015-05-04 LAB — RAPID URINE DRUG SCREEN, HOSP PERFORMED
AMPHETAMINES: NOT DETECTED
BARBITURATES: NOT DETECTED
Benzodiazepines: POSITIVE — AB
Cocaine: NOT DETECTED
Opiates: POSITIVE — AB
Tetrahydrocannabinol: NOT DETECTED

## 2015-05-04 LAB — TROPONIN I: Troponin I: <0.03 ng/mL (ref <0.031)

## 2015-05-04 MED ORDER — CLONAZEPAM 1 MG PO TABS: 1.0000 mg | ORAL_TABLET | Freq: Two times a day (BID) | ORAL | Status: DC

## 2015-05-04 MED ORDER — PROMETHAZINE HCL 25 MG PO TABS: 12.5000 mg | ORAL_TABLET | Freq: Four times a day (QID) | ORAL | Status: DC | PRN

## 2015-05-04 MED ORDER — TRAZODONE HCL 100 MG PO TABS
100.0000 mg | ORAL_TABLET | Freq: Every day | ORAL | Status: DC
  Administered 2015-05-04 – 2015-05-05 (×2): 100 mg via ORAL
  Filled 2015-05-04 (×2): qty 1

## 2015-05-04 MED ORDER — METHOCARBAMOL 500 MG PO TABS
1000.0000 mg | ORAL_TABLET | Freq: Once | ORAL | Status: AC
  Administered 2015-05-04: 1000 mg via ORAL
  Filled 2015-05-04: qty 2

## 2015-05-04 MED ORDER — BUDESONIDE-FORMOTEROL FUMARATE 160-4.5 MCG/ACT IN AERO
2.0000 | INHALATION_SPRAY | Freq: Two times a day (BID) | RESPIRATORY_TRACT | Status: DC
  Filled 2015-05-04: qty 6

## 2015-05-04 MED ORDER — BISMUTH SUBSALICYLATE 262 MG/15ML PO SUSP
15.0000 mL | Freq: Four times a day (QID) | ORAL | Status: DC | PRN
  Administered 2015-05-05 (×2): 15 mL via ORAL
  Filled 2015-05-04 (×2): qty 118

## 2015-05-04 MED ORDER — METHYLPREDNISOLONE SODIUM SUCC 40 MG IJ SOLR
40.0000 mg | Freq: Two times a day (BID) | INTRAMUSCULAR | Status: DC
  Administered 2015-05-05 – 2015-05-06 (×4): 40 mg via INTRAVENOUS
  Filled 2015-05-04 (×4): qty 1

## 2015-05-04 MED ORDER — DEXTROSE 5 % IV SOLN
1.0000 g | Freq: Once | INTRAVENOUS | Status: AC
  Administered 2015-05-04: 1 g via INTRAVENOUS

## 2015-05-04 MED ORDER — LEVALBUTEROL HCL 1.25 MG/0.5ML IN NEBU
1.2500 mg | INHALATION_SOLUTION | Freq: Four times a day (QID) | RESPIRATORY_TRACT | Status: DC
  Administered 2015-05-04: 1.25 mg via RESPIRATORY_TRACT
  Filled 2015-05-04: qty 0.5

## 2015-05-04 MED ORDER — ACETAMINOPHEN 650 MG RE SUPP: 650.0000 mg | Freq: Four times a day (QID) | RECTAL | Status: DC | PRN

## 2015-05-04 MED ORDER — VITAMIN D 1000 UNITS PO TABS
1000.0000 [IU] | ORAL_TABLET | Freq: Every day | ORAL | Status: DC
  Administered 2015-05-04 – 2015-05-06 (×3): 1000 [IU] via ORAL
  Filled 2015-05-04 (×4): qty 1

## 2015-05-04 MED ORDER — ARFORMOTEROL TARTRATE 15 MCG/2ML IN NEBU
15.0000 ug | INHALATION_SOLUTION | Freq: Two times a day (BID) | RESPIRATORY_TRACT | Status: DC
  Administered 2015-05-04 – 2015-05-05 (×3): 15 ug via RESPIRATORY_TRACT
  Filled 2015-05-04 (×4): qty 2

## 2015-05-04 MED ORDER — CLONAZEPAM 1 MG PO TABS
1.0000 mg | ORAL_TABLET | Freq: Two times a day (BID) | ORAL | Status: DC | PRN
  Administered 2015-05-04: 1 mg via ORAL
  Filled 2015-05-04: qty 1

## 2015-05-04 MED ORDER — ENOXAPARIN SODIUM 40 MG/0.4ML ~~LOC~~ SOLN
40.0000 mg | SUBCUTANEOUS | Status: DC
  Administered 2015-05-04 – 2015-05-05 (×2): 40 mg via SUBCUTANEOUS
  Filled 2015-05-04 (×2): qty 0.4

## 2015-05-04 MED ORDER — CEFTRIAXONE SODIUM 1 G IJ SOLR
INTRAMUSCULAR | Status: AC
  Filled 2015-05-04: qty 10

## 2015-05-04 MED ORDER — METHYLPREDNISOLONE SODIUM SUCC 125 MG IJ SOLR
125.0000 mg | Freq: Once | INTRAMUSCULAR | Status: AC
  Administered 2015-05-04: 125 mg via INTRAVENOUS
  Filled 2015-05-04: qty 2

## 2015-05-04 MED ORDER — ACETAMINOPHEN 325 MG PO TABS: 650.0000 mg | ORAL_TABLET | ORAL | Status: DC | PRN

## 2015-05-04 MED ORDER — GUAIFENESIN ER 600 MG PO TB12
600.0000 mg | ORAL_TABLET | Freq: Two times a day (BID) | ORAL | Status: DC
  Administered 2015-05-04 – 2015-05-06 (×4): 600 mg via ORAL
  Filled 2015-05-04 (×4): qty 1

## 2015-05-04 MED ORDER — LEVALBUTEROL HCL 1.25 MG/0.5ML IN NEBU
1.2500 mg | INHALATION_SOLUTION | Freq: Four times a day (QID) | RESPIRATORY_TRACT | Status: DC
  Administered 2015-05-05: 1.25 mg via RESPIRATORY_TRACT
  Filled 2015-05-04: qty 0.5

## 2015-05-04 MED ORDER — INFLUENZA VAC SPLIT QUAD 0.5 ML IM SUSY
0.5000 mL | PREFILLED_SYRINGE | INTRAMUSCULAR | Status: AC
  Administered 2015-05-05: 0.5 mL via INTRAMUSCULAR
  Filled 2015-05-04: qty 0.5

## 2015-05-04 MED ORDER — ALBUTEROL SULFATE (2.5 MG/3ML) 0.083% IN NEBU
5.0000 mg | INHALATION_SOLUTION | RESPIRATORY_TRACT | Status: DC
  Administered 2015-05-04: 5 mg via RESPIRATORY_TRACT
  Filled 2015-05-04: qty 6

## 2015-05-04 MED ORDER — ONDANSETRON HCL 4 MG/2ML IJ SOLN
INTRAMUSCULAR | Status: AC
  Administered 2015-05-04: 4 mg
  Filled 2015-05-04: qty 2

## 2015-05-04 MED ORDER — GABAPENTIN 100 MG PO CAPS
100.0000 mg | ORAL_CAPSULE | Freq: Every day | ORAL | Status: DC
  Administered 2015-05-04 – 2015-05-05 (×2): 100 mg via ORAL
  Filled 2015-05-04 (×2): qty 1

## 2015-05-04 MED ORDER — KETOROLAC TROMETHAMINE 30 MG/ML IJ SOLN
30.0000 mg | Freq: Once | INTRAMUSCULAR | Status: AC
  Administered 2015-05-04: 30 mg via INTRAVENOUS
  Filled 2015-05-04: qty 1

## 2015-05-04 MED ORDER — ACETAMINOPHEN 325 MG PO TABS
ORAL_TABLET | ORAL | Status: AC
  Administered 2015-05-04: 650 mg
  Filled 2015-05-04: qty 2

## 2015-05-04 MED ORDER — BUDESONIDE 0.5 MG/2ML IN SUSP
0.5000 mg | Freq: Two times a day (BID) | RESPIRATORY_TRACT | Status: DC
  Administered 2015-05-04 – 2015-05-05 (×3): 0.5 mg via RESPIRATORY_TRACT
  Filled 2015-05-04 (×4): qty 2

## 2015-05-04 MED ORDER — LEVALBUTEROL HCL 1.25 MG/0.5ML IN NEBU: 1.2500 mg | INHALATION_SOLUTION | RESPIRATORY_TRACT | Status: DC | PRN

## 2015-05-04 MED ORDER — ACETAMINOPHEN 325 MG PO TABS
650.0000 mg | ORAL_TABLET | Freq: Four times a day (QID) | ORAL | Status: DC | PRN
  Administered 2015-05-05: 650 mg via ORAL
  Filled 2015-05-04: qty 2

## 2015-05-04 MED ORDER — ENOXAPARIN SODIUM 40 MG/0.4ML ~~LOC~~ SOLN: 40.0000 mg | SUBCUTANEOUS | Status: DC

## 2015-05-04 MED ORDER — LORAZEPAM 2 MG/ML IJ SOLN
INTRAMUSCULAR | Status: AC
  Administered 2015-05-04: 0.5 mg
  Filled 2015-05-04: qty 1

## 2015-05-04 MED ORDER — SODIUM CHLORIDE 0.9 % IJ SOLN
3.0000 mL | Freq: Two times a day (BID) | INTRAMUSCULAR | Status: DC
  Administered 2015-05-04 – 2015-05-06 (×4): 3 mL via INTRAVENOUS

## 2015-05-04 NOTE — ED Provider Notes (Signed)
CSN: [id number]     Arrival date & time 05/04/15  0448 History   First MD Initiated Contact with Patient 05/04/15 0455     Chief Complaint  Patient presents with  . Near Syncope  . Fall     (Consider location/radiation/quality/duration/timing/severity/associated sxs/prior Treatment) Patient is a 65 y.o. female presenting with near-syncope, fall, and dizziness. The history is provided by the patient and the spouse.  Near Syncope This is a new problem. The current episode started 12 to 24 hours ago. The problem occurs constantly. The problem has not changed since onset.Associated symptoms include shortness of breath. Pertinent negatives include no chest pain and no abdominal pain. Nothing aggravates the symptoms. Nothing relieves the symptoms. She has tried nothing for the symptoms. The treatment provided no relief.  Fall This is a new problem. The current episode started 12 to 24 hours ago. The problem occurs constantly. The problem has not changed since onset.Associated symptoms include shortness of breath. Pertinent negatives include no chest pain and no abdominal pain. Nothing aggravates the symptoms. Nothing relieves the symptoms. She has tried nothing for the symptoms. The treatment provided no relief.  Dizziness Quality:  Unable to specify Severity:  Moderate Onset quality:  Gradual Timing:  Constant Progression:  Unchanged Chronicity:  New Context: not when bending over   Relieved by:  Nothing Worsened by:  Nothing Ineffective treatments: took husbands meclizine and increased her O2. Associated symptoms: shortness of breath   Associated symptoms: no chest pain, no palpitations and no vomiting   Risk factors: no Meniere's disease     Past Medical History  Diagnosis Date  . COPD (chronic obstructive pulmonary disease) (HCC)   . Arthritis   . Anxiety   . Depression    Past Surgical History  Procedure Laterality Date  . Breast enhancement surgery     History reviewed. No  pertinent family history. Social History  Substance Use Topics  . Smoking status: Current Every Day Smoker -- 0.50 packs/day    Types: Cigarettes  . Smokeless tobacco: None  . Alcohol Use: No   OB History    No data available     Review of Systems  Constitutional: Negative for fever.  Respiratory: Positive for cough and shortness of breath.   Cardiovascular: Positive for near-syncope. Negative for chest pain, palpitations and leg swelling.  Gastrointestinal: Negative for vomiting and abdominal pain.  Neurological: Positive for dizziness. Negative for facial asymmetry and numbness.  All other systems reviewed and are negative.     Allergies  Contrast media  Home Medications   Prior to Admission medications   Medication Sig Start Date End Date Taking? Authorizing Provider  albuterol (PROVENTIL HFA;VENTOLIN HFA) 108 (90 BASE) MCG/ACT inhaler Inhale 1 puff into the lungs every 6 (six) hours as needed for wheezing.    Historical Provider, MD  Aspirin-Acetaminophen-Caffeine (GOODYS EXTRA STRENGTH PO) Take 1 Package by mouth every 6 (six) hours as needed (headaches).    Historical Provider, MD  budesonide-formoterol (SYMBICORT) 160-4.5 MCG/ACT inhaler Inhale 2 puffs into the lungs 2 (two) times daily.    Historical Provider, MD  cefUROXime (CEFTIN) 500 MG tablet Take 1 tablet (500 mg total) by mouth 2 (two) times daily. 12/08/12   Christiane Ha, MD  clonazePAM (KLONOPIN) 1 MG tablet Take 1 mg by mouth 2 (two) times daily as needed for anxiety.    Historical Provider, MD  levalbuterol (XOPENEX) 0.31 MG/3ML nebulizer solution Take 1 ampule by nebulization every 4 (four) hours as needed for  wheezing.    Historical Provider, MD  oxyCODONE-acetaminophen (PERCOCET/ROXICET) 5-325 MG tablet Take 1-2 tablets by mouth every 4 (four) hours as needed. 03/13/15   Mirian Mo, MD  QUEtiapine (SEROQUEL) 50 MG tablet Take 1 tablet (50 mg total) by mouth at bedtime. 12/08/12   Christiane Ha,  MD  tiotropium (SPIRIVA) 18 MCG inhalation capsule Place 18 mcg into inhaler and inhale daily.    Historical Provider, MD   BP 117/74 mmHg  Pulse 78  Temp(Src) 98.2 F (36.8 C) (Oral)  Resp 18  SpO2 99% Physical Exam  Constitutional: She appears well-developed and well-nourished.  HENT:  Head: Normocephalic and atraumatic. Head is without raccoon's eyes, without Battle's sign and without abrasion.  Right Ear: External ear normal.  Left Ear: External ear normal.  Mouth/Throat: Oropharynx is clear and moist.  Eyes: Conjunctivae and EOM are normal. Pupils are equal, round, and reactive to light.  Neck: Normal range of motion. Neck supple.  Cardiovascular: Normal rate and regular rhythm.   Pulmonary/Chest: No stridor. She has decreased breath sounds. She has no rales.    Abdominal: Soft. Bowel sounds are normal. There is no tenderness. There is no rebound and no guarding.  Musculoskeletal: Normal range of motion. She exhibits no edema or tenderness.  Neurological: She is alert. She has normal reflexes. GCS eye subscore is 4. GCS verbal subscore is 4. GCS motor subscore is 6.  Skin: Skin is warm and dry. She is not diaphoretic.  Psychiatric: She has a normal mood and affect.    ED Course  Procedures (including critical care time) Labs Review Labs Reviewed  CBC WITH DIFFERENTIAL/PLATELET - Abnormal; Notable for the following:    RBC 3.57 (*)    Hemoglobin 10.6 (*)    HCT 34.3 (*)    RDW 11.2 (*)    All other components within normal limits  BASIC METABOLIC PANEL - Abnormal; Notable for the following:    Chloride 100 (*)    CO2 37 (*)    Glucose, Bld 102 (*)    Calcium 8.5 (*)    All other components within normal limits  URINALYSIS, ROUTINE W REFLEX MICROSCOPIC (NOT AT [facility]) - Abnormal; Notable for the following:    APPearance CLOUDY (*)    All other components within normal limits  I-STAT ARTERIAL BLOOD GAS, ED - Abnormal; Notable for the following:    pCO2 arterial 63.7  (*)    pO2, Arterial 109.0 (*)    Bicarbonate 37.7 (*)    Acid-Base Excess 10.0 (*)    All other components within normal limits  TROPONIN I  BLOOD GAS, ARTERIAL  URINE RAPID DRUG SCREEN, HOSP PERFORMED    Imaging Review Dg Chest 2 View  05/04/2015  CLINICAL DATA:  Multiple falls with bruising to the left breast. Dizziness. EXAM: CHEST  2 VIEW COMPARISON:  03/19/2015 FINDINGS: Hyperinflation with emphysematous change and superimposed coarse linear scarring. There is no edema, consolidation, effusion, or pneumothorax (linear densities at the right apex have an artifactual appearance). Biapical nodular densities are best attributed to EKG pads, also and the left base. Impacted surgical neck fracture of the right humerus with probable partial healing. Alignment is similar to prior. Calcified breast implants. Normal heart size and stable mediastinal contours. IMPRESSION: 1. Severe emphysema with lung scarring. No superimposed acute cardiopulmonary finding. 2. Subacute surgical neck fracture of the right humerus, impacted. Electronically Signed   By: Marnee Spring M.D.   On: 05/04/2015 07:09   Ct Head Wo Contrast  05/04/2015  CLINICAL DATA:  65 year old female with fall.  No head injury. EXAM: CT HEAD WITHOUT CONTRAST TECHNIQUE: Contiguous axial images were obtained from the base of the skull through the vertex without intravenous contrast. COMPARISON:  Brain MRI dated 09/08/2014 FINDINGS: Evaluation of this exam is limited due to motion artifact. The ventricles and sulci are appropriate in size for patient's age. Minimal periventricular and deep white matter hypodensities represent chronic microvascular ischemic changes. There is no intracranial hemorrhage. No mass effect or midline shift identified. The visualized paranasal sinuses and mastoid air cells are well aerated. The calvarium is intact. IMPRESSION: No acute intracranial pathology. Electronically Signed   By: Elgie Collard M.D.   On:  05/04/2015 06:33   Dg Hip Unilat With Pelvis 2-3 Views Right  05/04/2015  CLINICAL DATA:  65 year old female with fall and right hip pain. 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 There is no acute fracture or dislocation. Caps the bones are mildly osteopenic. The soft tissues are grossly unremarkable. IMPRESSION: No acute fracture or dislocation. Electronically Signed   By: Elgie Collard M.D.   On: 05/04/2015 06:36   I have personally reviewed and evaluated these images and lab results as part of my medical decision-making.   EKG Interpretation   Date/Time:  Tuesday May 04 2015 05:06:24 EST Ventricular Rate:  77 PR Interval:  134 QRS Duration: 87 QT Interval:  406 QTC Calculation: 459 R Axis:   81 Text Interpretation:  Sinus rhythm Confirmed by [facility]  MD, Shanikqua Zarzycki  ([id number]) on 05/04/2015 6:09:34 AM      MDM   Final diagnoses:  Other emphysema (HCC)  Hypercarbia  Dizziness and giddiness  Near syncope  Falls, initial encounter    Results for orders placed or performed during the hospital encounter of 05/04/15  CBC with Differential/Platelet  Result Value Ref Range   WBC 5.0 4.0 - 10.5 K/uL   RBC 3.57 (L) 3.87 - 5.11 MIL/uL   Hemoglobin 10.6 (L) 12.0 - 15.0 g/dL   HCT 02.7 (L) 25.3 - 66.4 %   MCV 96.1 78.0 - 100.0 fL   MCH 29.7 26.0 - 34.0 pg   MCHC 30.9 30.0 - 36.0 g/dL   RDW 40.3 (L) 47.4 - 25.9 %   Platelets 161 150 - 400 K/uL   Neutrophils Relative % 68 %   Neutro Abs 3.4 1.7 - 7.7 K/uL   Lymphocytes Relative 17 %   Lymphs Abs 0.9 0.7 - 4.0 K/uL   Monocytes Relative 11 %   Monocytes Absolute 0.5 0.1 - 1.0 K/uL   Eosinophils Relative 3 %   Eosinophils Absolute 0.1 0.0 - 0.7 K/uL   Basophils Relative 1 %   Basophils Absolute 0.0 0.0 - 0.1 K/uL  Basic metabolic panel  Result Value Ref Range   Sodium 142 135 - 145 mmol/L   Potassium 3.5 3.5  - 5.1 mmol/L   Chloride 100 (L) 101 - 111 mmol/L   CO2 37 (H) 22 - 32 mmol/L   Glucose, Bld 102 (H) 65 - 99 mg/dL   BUN 11 6 - 20 mg/dL   Creatinine, Ser 5.63 0.44 - 1.00 mg/dL   Calcium 8.5 (L) 8.9 - 10.3 mg/dL   GFR calc non Af Amer >60 >60 mL/min   GFR calc Af Amer >60 >60 mL/min   Anion gap 5 5 - 15  Urinalysis, Routine w reflex microscopic (not  at [facility])  Result Value Ref Range   Color, Urine YELLOW YELLOW   APPearance CLOUDY (A) CLEAR   Specific Gravity, Urine 1.019 1.005 - 1.030   pH 7.0 5.0 - 8.0   Glucose, UA NEGATIVE NEGATIVE mg/dL   Hgb urine dipstick NEGATIVE NEGATIVE   Bilirubin Urine NEGATIVE NEGATIVE   Ketones, ur NEGATIVE NEGATIVE mg/dL   Protein, ur NEGATIVE NEGATIVE mg/dL   Nitrite NEGATIVE NEGATIVE   Leukocytes, UA NEGATIVE NEGATIVE  Troponin I  Result Value Ref Range   Troponin I <0.03 <0.031 ng/mL  I-Stat arterial blood gas, ED  Result Value Ref Range   pH, Arterial 7.379 7.350 - 7.450   pCO2 arterial 63.7 (HH) 35.0 - 45.0 mmHg   pO2, Arterial 109.0 (H) 80.0 - 100.0 mmHg   Bicarbonate 37.7 (H) 20.0 - 24.0 mEq/L   TCO2 40 0 - 100 mmol/L   O2 Saturation 98.0 %   Acid-Base Excess 10.0 (H) 0.0 - 2.0 mmol/L   Patient temperature 98.2 F    Collection site RADIAL, ALLEN'S TEST ACCEPTABLE    Drawn by RT    Sample type ARTERIAL    Comment NOTIFIED PHYSICIAN    Dg Chest 2 View  05/04/2015  CLINICAL DATA:  Multiple falls with bruising to the left breast. Dizziness. EXAM: CHEST  2 VIEW COMPARISON:  03/19/2015 FINDINGS: Hyperinflation with emphysematous change and superimposed coarse linear scarring. There is no edema, consolidation, effusion, or pneumothorax (linear densities at the right apex have an artifactual appearance). Biapical nodular densities are best attributed to EKG pads, also and the left base. Impacted surgical neck fracture of the right humerus with probable partial healing. Alignment is similar to prior. Calcified breast implants. Normal heart size  and stable mediastinal contours. IMPRESSION: 1. Severe emphysema with lung scarring. No superimposed acute cardiopulmonary finding. 2. Subacute surgical neck fracture of the right humerus, impacted. Electronically Signed   By: Marnee Spring M.D.   On: 05/04/2015 07:09   Ct Head Wo Contrast  05/04/2015  CLINICAL DATA:  65 year old female with fall.  No head injury. EXAM: CT HEAD WITHOUT CONTRAST TECHNIQUE: Contiguous axial images were obtained from the base of the skull through the vertex without intravenous contrast. COMPARISON:  Brain MRI dated 09/08/2014 FINDINGS: Evaluation of this exam is limited due to motion artifact. The ventricles and sulci are appropriate in size for patient's age. Minimal periventricular and deep white matter hypodensities represent chronic microvascular ischemic changes. There is no intracranial hemorrhage. No mass effect or midline shift identified. The visualized paranasal sinuses and mastoid air cells are well aerated. The calvarium is intact. IMPRESSION: No acute intracranial pathology. Electronically Signed   By: Elgie Collard M.D.   On: 05/04/2015 06:33   Dg Hip Unilat With Pelvis 2-3 Views Right  05/04/2015  CLINICAL DATA:  65 year old female with fall and right hip pain. 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 There is no acute fracture or dislocation. Caps the bones are mildly osteopenic. The soft tissues are grossly unremarkable. IMPRESSION: No acute fracture or dislocation. Electronically Signed   By: Elgie Collard M.D.   On: 05/04/2015 06:36    Medications  cefTRIAXone (ROCEPHIN) 1 g in dextrose 5 % 50 mL IVPB (not administered)  ketorolac (TORADOL) 30 MG/ML injection 30 mg (30 mg Intravenous Given 05/04/15 1610)  methylPREDNISolone sodium succinate (SOLU-MEDROL) 125 mg/2 mL injection 125 mg (125 mg Intravenous Given 05/04/15  1610)    methocarbamol (ROBAXIN) tablet 1,000 mg (1,000 mg Oral Given 05/04/15 0712)       Arieon Scalzo, MD 05/04/15 [id number]

## 2015-05-04 NOTE — ED Notes (Signed)
Pt c/o dizziness that is worse with movement and makes her feel like she is going to pass out.  Today at 1700, pt fell and c/o right hip pain.  She is unsure if she hit her head.  Pt is able to bear weight on affected leg, no deformity noted.  She has a bruise on her left breast that she states has been there since April when she fell another time.  Pt is alert and oriented, is somewhat confused as to her recognition of staff and it is unclear if she knows what hospital she is currently in as she is seen at multiple different facilities.  It seems that pt is more concerned about her pain from the fall than anything else at this point and she has an appointment with her PCP later today.

## 2015-05-04 NOTE — Progress Notes (Signed)
[facility] transfer  65 yo w/ h/o COPd (oxygen dependent), severe depression/anxiety, recent poor healing R humeral fracture, presenting to [facility] w/ confusion, CP, and possible syncope. Pt to transfer to [facility] for Telemetry - Obs.   Shelly Flatten, MD Family Medicine 05/04/2015, 8:34 AM

## 2015-05-04 NOTE — ED Notes (Signed)
Patient transported to CT 

## 2015-05-04 NOTE — ED Notes (Signed)
[facility] is aware of the bed 5W39 @ [facility]

## 2015-05-04 NOTE — H&P (Signed)
[facility] History and Physical  Erin Lozano ZOX:[id number] DOB: 1950-12-03 DOA: 05/04/2015  Referring physician: ED PCP: PROVIDER NOT IN SYSTEM   Chief Complaint:  Larey Seat and my right hip hurts  HPI:  Erin Lozano is a 65 year old female with a has a past medical history of COPD, oxygen dependent on 2 L nasal cannula, severe anxiety, depression, and arthritis; who presents with complaints of dizziness, chest pain, possible syncopal episode. Patient is a poor historian as she does not recall why she is in the hospital seems to be somewhat confused. Patient husband was contacted but no one answered the phone. She notes that she had a recent fall 3 weeks ago she hit her right breast since that time she complains her equilibrium has been off.  Also reports a fall today which she does not know if she hit her head or not while at home and complained of right hip pain. Notes the pain is worse in the right leg with any weight bearing.  Upon admission into the emergency department the patient is evaluated and lab work reveals a hemoglobin 10.6, bicarbonate of 37, troponin negative, UDS positive for benzos and opiates, urinalysis negative. CT of head negative for any acute abnormalities, especially x-ray showing severe emphysema, x-rays of the right hip negative for any acute abnormality, and EKG sinus rhythm.    Review of Systems  Constitutional: Negative for chills and weight loss.  HENT: Negative for ear discharge and ear pain.   Eyes: Negative for double vision and photophobia.  Respiratory: Positive for cough and shortness of breath.   Cardiovascular: Positive for chest pain. Negative for palpitations.  Gastrointestinal: Negative for nausea, vomiting and abdominal pain.  Genitourinary: Negative for urgency and frequency.  Musculoskeletal: Positive for joint pain and falls.  Skin: Negative for itching and rash.  Neurological: Negative for focal weakness and seizures.       Near syncope   Endo/Heme/Allergies: Negative for polydipsia. Bruises/bleeds easily.  Psychiatric/Behavioral: Negative for hallucinations. The patient is nervous/anxious.        Confusion       Past Medical History  Diagnosis Date  . COPD (chronic obstructive pulmonary disease) (HCC)   . Arthritis   . Anxiety   . Depression      Past Surgical History  Procedure Laterality Date  . Breast enhancement surgery        Social History:  reports that she has been smoking Cigarettes.  She has been smoking about 0.50 packs per day. She does not have any smokeless tobacco history on file. She reports that she does not drink alcohol or use illicit drugs. Where does patient live--home  and with whom if at home? husband Can patient participate in ADLs?  Allergies  Allergen Reactions  . Contrast Media [Iodinated Diagnostic Agents] Hives  . Seroquel [Quetiapine Fumarate] Other (See Comments)    hallucinations    History reviewed. No pertinent family history.      Prior to Admission medications   Medication Sig Start Date End Date Taking? Authorizing Provider  albuterol (PROVENTIL HFA;VENTOLIN HFA) 108 (90 BASE) MCG/ACT inhaler Inhale 1 puff into the lungs every 6 (six) hours as needed for wheezing.   Yes Historical Provider, MD  bismuth subsalicylate (PEPTO BISMOL) 262 MG/15ML suspension Take 15 mLs by mouth every 6 (six) hours as needed for indigestion.   Yes Historical Provider, MD  budesonide-formoterol (SYMBICORT) 160-4.5 MCG/ACT inhaler Inhale 2 puffs into the lungs 2 (two) times daily.   Yes Historical Provider,  MD  Cholecalciferol (VITAMIN D PO) Take 1 tablet by mouth daily.   Yes Historical Provider, MD  clonazePAM (KLONOPIN) 1 MG tablet Take 1 mg by mouth 2 (two) times daily.    Yes Historical Provider, MD  gabapentin (NEURONTIN) 100 MG capsule Take 100 mg by mouth at bedtime.   Yes Historical Provider, MD  GuaiFENesin (MUCINEX PO) Take 15 mLs by mouth at bedtime as needed (cough).   Yes  Historical Provider, MD  levalbuterol (XOPENEX) 1.25 MG/3ML nebulizer solution Take 1.25 mg by nebulization every 4 (four) hours as needed for wheezing.   Yes Historical Provider, MD  OXYGEN Inhale into the lungs continuous. 2L   Yes Historical Provider, MD  traZODone (DESYREL) 100 MG tablet Take 100 mg by mouth at bedtime. 03/22/15  Yes Historical Provider, MD  oxyCODONE-acetaminophen (PERCOCET/ROXICET) 5-325 MG tablet Take 1-2 tablets by mouth every 4 (four) hours as needed. Patient not taking: Reported on 05/04/2015 03/13/15   Mirian Mo, MD     Physical Exam: Filed Vitals:   05/04/15 1726 05/04/15 1728 05/04/15 1729 05/04/15 1840  BP: 107/89  107/89 138/61  Pulse: 74  74 79  Temp: 98.4 F (36.9 C)  98.4 F (36.9 C) 98.1 F (36.7 C)  TempSrc: Oral  Oral Oral  Resp:  18 18 18   SpO2: 99%  98% 100%     Constitutional: Vital signs reviewed. Patient is a well-developed and well-nourished in no acute distress and cooperative with exam. Alert and oriented to person Head: Normocephalic and atraumatic  Ear: TM normal bilaterally  Mouth: no erythema or exudates, MMM  Eyes: PERRL, EOMI, conjunctivae normal, No scleral icterus.  Neck: Supple, Trachea midline normal ROM, No JVD, mass, thyromegaly, or carotid bruit present.  Cardiovascular: RRR, S1 normal, S2 normal, no MRG, pulses symmetric and intact bilaterally  Pulmonary/Chest:  hyperinflated lung with decreased aeration. Chest pain reproducible with palpation of the left and right chest wall. Abdominal: Soft. Non-tender, non-distended, bowel sounds are normal, no masses, organomegaly, or guarding present.  GU: no CVA tenderness Musculoskeletal: No joint deformities, erythema, or stiffness, patient expresses right hip pain and tenderness to palpation pain worsen with active/passive motion  Ext: no edema and no cyanosis, pulses palpable bilaterally (DP and PT)  Hematology: no cervical, inginal, or axillary adenopathy.  Neurological: A&O  x3, Strenght is normal and symmetric bilaterally, cranial nerve II-XII are grossly intact, no focal motor deficit, sensory intact to light touch bilaterally.  Skin: Warm, dry and intact. No rash, cyanosis, or clubbing.  Psychiatric: Normal mood and affect. speech and behavior is normal. Judgment and thought content normal. Cognition and memory are normal.      Data Review   Micro Results No results found for this or any previous visit (from the past 240 hour(s)).  Radiology Reports Dg Chest 2 View  05/04/2015  CLINICAL DATA:  Multiple falls with bruising to the left breast. Dizziness. EXAM: CHEST  2 VIEW COMPARISON:  03/19/2015 FINDINGS: Hyperinflation with emphysematous change and superimposed coarse linear scarring. There is no edema, consolidation, effusion, or pneumothorax (linear densities at the right apex have an artifactual appearance). Biapical nodular densities are best attributed to EKG pads, also and the left base. Impacted surgical neck fracture of the right humerus with probable partial healing. Alignment is similar to prior. Calcified breast implants. Normal heart size and stable mediastinal contours. IMPRESSION: 1. Severe emphysema with lung scarring. No superimposed acute cardiopulmonary finding. 2. Subacute surgical neck fracture of the right humerus, impacted. Electronically Signed  By: Marnee Spring M.D.   On: 05/04/2015 07:09   Ct Head Wo Contrast  05/04/2015  CLINICAL DATA:  65 year old female with fall.  No head injury. EXAM: CT HEAD WITHOUT CONTRAST TECHNIQUE: Contiguous axial images were obtained from the base of the skull through the vertex without intravenous contrast. COMPARISON:  Brain MRI dated 09/08/2014 FINDINGS: Evaluation of this exam is limited due to motion artifact. The ventricles and sulci are appropriate in size for patient's age. Minimal periventricular and deep white matter hypodensities represent chronic microvascular ischemic changes. There is no  intracranial hemorrhage. No mass effect or midline shift identified. The visualized paranasal sinuses and mastoid air cells are well aerated. The calvarium is intact. IMPRESSION: No acute intracranial pathology. Electronically Signed   By: Elgie Collard M.D.   On: 05/04/2015 06:33   Dg Hip Unilat With Pelvis 2-3 Views Right  05/04/2015  CLINICAL DATA:  65 year old female with fall and right hip pain. 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 There is no acute fracture or dislocation. Caps the bones are mildly osteopenic. The soft tissues are grossly unremarkable. IMPRESSION: No acute fracture or dislocation. Electronically Signed   By: Elgie Collard M.D.   On: 05/04/2015 06:36     CBC  Recent Labs Lab 05/04/15 0520  WBC 5.0  HGB 10.6*  HCT 34.3*  PLT 161  MCV 96.1  MCH 29.7  MCHC 30.9  RDW 11.2*  LYMPHSABS 0.9  MONOABS 0.5  EOSABS 0.1  BASOSABS 0.0    Chemistries   Recent Labs Lab 05/04/15 0520  NA 142  K 3.5  CL 100*  CO2 37*  GLUCOSE 102*  BUN 11  CREATININE 0.49  CALCIUM 8.5*   ------------------------------------------------------------------------------------------------------------------ CrCl cannot be calculated (Unknown ideal weight.). ------------------------------------------------------------------------------------------------------------------ No results for input(s): HGBA1C in the last 72 hours. ------------------------------------------------------------------------------------------------------------------ No results for input(s): CHOL, HDL, LDLCALC, TRIG, CHOLHDL, LDLDIRECT in the last 72 hours. ------------------------------------------------------------------------------------------------------------------ No results for input(s): TSH, T4TOTAL, T3FREE, THYROIDAB in the last 72 hours.  Invalid input(s):  FREET3 ------------------------------------------------------------------------------------------------------------------ No results for input(s): VITAMINB12, FOLATE, FERRITIN, TIBC, IRON, RETICCTPCT in the last 72 hours.  Coagulation profile No results for input(s): INR, PROTIME in the last 168 hours.  No results for input(s): DDIMER in the last 72 hours.  Cardiac Enzymes  Recent Labs Lab 05/04/15 0520  TROPONINI <0.03   ------------------------------------------------------------------------------------------------------------------ Invalid input(s): POCBNP   CBG: No results for input(s): GLUCAP in the last 168 hours.     EKG: Independently reviewed. Sinus rhythm  Assessment/Plan Principal Problem:    Acute encephalopathy: Patient with generalized confusion and reports of near syncope. Initial evaluation work shows patient to be positive for benzos and opiates as well as seen to be hypercapnic with PCO2 of 67 on blood gas.  - Admit to telemetry bed - Set bed alarm - Neurochecks   COPD (chronic obstructive pulmonary disease) (HCC) Suspect patient may be having a acute exacerbation. Patient was given Solu-Medrol and ceftriaxone in ED she is afebrile as acute signs of chest x-ray - Budesonide, Brovana, Levalbuterol nebs scheduled - mucinex - Solu-Medrol  IV   - check Crp/ procalcitionin - question need to continue antibiotics - Continue nasal cannula oxygen with continuous pulse oximetry  Hypercapnia: PCO2 elevated at 63.7 on admission suspect this probably close to where the patient lives at baseline.  - see above  Right hip pain: Status post fall patient complaint  right hip pain. Xray of hip shows no acute abnormality. Patient's right hip pain appears to be her primary reason for presenting to the hospital. - Physical therapy to eval and treat - trying to avoid narcotic medications at this time as patient is somewhat confused and altered    Suspected  Costochondritis of the chest wall: Patient complaining of chest pain that is reproducible physical exam. EKG sinus rhythm and initial troponins negative. - Follow-up telemetry overnight - trend troponins  Near syncope - Check orthostatic vital signs   Anemia - Continue to monitor  - Check vitamin B-12 level as this can lead to gait disturbances      Code Status:   full Family Communication: bedside Disposition Plan: admit   Total time spent 55 minutes.Greater than 50% of this time was spent in counseling, explanation of diagnosis, planning of further management, and coordination of care  Clydie Braun [facility] Pager [phone]  If 7PM-7AM, please contact night-coverage [email or web address] Password [facility] 05/04/2015, 7:56 PM

## 2015-05-04 NOTE — Progress Notes (Signed)
NURSING PROGRESS NOTE  Erin Lozano [id number] Admission Data: 05/04/2015 7:38 PM Attending Provider: Clydie Braun, MD UXN:[id number] NOT IN SYSTEM Code Status: Full  Erin Lozano is a 65 y.o. female patient admitted from ED:  -No acute distress noted.  -No complaints of shortness of breath.  -No complaints of chest pain.   Blood pressure 138/61, pulse 79, temperature 98.1 F (36.7 C), temperature source Oral, resp. rate 18, SpO2 100 %.   IV Fluids:  IV in place, occlusive dsg intact without redness, IV cath forearm right, condition patent and no redness none.   Allergies:  Contrast media and Seroquel  Past Medical History:   has a past medical history of COPD (chronic obstructive pulmonary disease) (HCC); Arthritis; Anxiety; and Depression.  Past Surgical History:   has past surgical history that includes Breast enhancement surgery.  Social History:   reports that she has been smoking Cigarettes.  She has been smoking about 0.50 packs per day. She does not have any smokeless tobacco history on file. She reports that she does not drink alcohol or use illicit drugs.  Skin: Intact  Patient/Family orientated to room. Information packet given to patient/family. Admission inpatient armband information verified with patient/family to include name and date of birth and placed on patient arm. Side rails up x 2, fall assessment and education completed with patient/family. Patient/family able to verbalize understanding of risk associated with falls and verbalized understanding to call for assistance before getting out of bed. Call light within reach. Patient/family able to voice and demonstrate understanding of unit orientation instructions.    Will continue to evaluate and treat per MD orders.

## 2015-05-04 NOTE — ED Notes (Addendum)
Pt has had 3 days of dizziness and today had a fall. Was found on floor by family 1700 yesterday and has scheduled appointment w/ provider today but was having so much pain related to fall and SOB she came into be seen in ED.

## 2015-05-04 NOTE — ED Notes (Signed)
[facility] at bedside for transport. 

## 2015-05-05 ENCOUNTER — Inpatient Hospital Stay (HOSPITAL_COMMUNITY): Payer: Medicare Other

## 2015-05-05 ENCOUNTER — Ambulatory Visit (HOSPITAL_COMMUNITY): Payer: Medicare Other

## 2015-05-05 DIAGNOSIS — Z79899 Other long term (current) drug therapy: Secondary | ICD-10-CM | POA: Diagnosis not present

## 2015-05-05 DIAGNOSIS — Z9882 Breast implant status: Secondary | ICD-10-CM | POA: Diagnosis not present

## 2015-05-05 DIAGNOSIS — Z91041 Radiographic dye allergy status: Secondary | ICD-10-CM | POA: Diagnosis not present

## 2015-05-05 DIAGNOSIS — F17210 Nicotine dependence, cigarettes, uncomplicated: Secondary | ICD-10-CM | POA: Diagnosis present

## 2015-05-05 DIAGNOSIS — G92 Toxic encephalopathy: Secondary | ICD-10-CM | POA: Diagnosis present

## 2015-05-05 DIAGNOSIS — Z7951 Long term (current) use of inhaled steroids: Secondary | ICD-10-CM | POA: Diagnosis not present

## 2015-05-05 DIAGNOSIS — D649 Anemia, unspecified: Secondary | ICD-10-CM | POA: Diagnosis present

## 2015-05-05 DIAGNOSIS — M940 Chondrocostal junction syndrome [Tietze]: Secondary | ICD-10-CM | POA: Diagnosis not present

## 2015-05-05 DIAGNOSIS — T424X5A Adverse effect of benzodiazepines, initial encounter: Secondary | ICD-10-CM | POA: Diagnosis present

## 2015-05-05 DIAGNOSIS — Z9981 Dependence on supplemental oxygen: Secondary | ICD-10-CM | POA: Diagnosis not present

## 2015-05-05 DIAGNOSIS — Y92009 Unspecified place in unspecified non-institutional (private) residence as the place of occurrence of the external cause: Secondary | ICD-10-CM | POA: Diagnosis not present

## 2015-05-05 DIAGNOSIS — G9340 Encephalopathy, unspecified: Secondary | ICD-10-CM

## 2015-05-05 DIAGNOSIS — M25551 Pain in right hip: Secondary | ICD-10-CM | POA: Diagnosis present

## 2015-05-05 DIAGNOSIS — R079 Chest pain, unspecified: Secondary | ICD-10-CM | POA: Diagnosis not present

## 2015-05-05 DIAGNOSIS — M1990 Unspecified osteoarthritis, unspecified site: Secondary | ICD-10-CM | POA: Diagnosis present

## 2015-05-05 DIAGNOSIS — T426X5A Adverse effect of other antiepileptic and sedative-hypnotic drugs, initial encounter: Secondary | ICD-10-CM | POA: Diagnosis not present

## 2015-05-05 DIAGNOSIS — J438 Other emphysema: Secondary | ICD-10-CM | POA: Diagnosis present

## 2015-05-05 DIAGNOSIS — T402X5A Adverse effect of other opioids, initial encounter: Secondary | ICD-10-CM | POA: Diagnosis not present

## 2015-05-05 DIAGNOSIS — Z9181 History of falling: Secondary | ICD-10-CM | POA: Diagnosis not present

## 2015-05-05 DIAGNOSIS — S42211D Unspecified displaced fracture of surgical neck of right humerus, subsequent encounter for fracture with routine healing: Secondary | ICD-10-CM | POA: Diagnosis not present

## 2015-05-05 DIAGNOSIS — J209 Acute bronchitis, unspecified: Secondary | ICD-10-CM | POA: Diagnosis not present

## 2015-05-05 DIAGNOSIS — T43215A Adverse effect of selective serotonin and norepinephrine reuptake inhibitors, initial encounter: Secondary | ICD-10-CM | POA: Diagnosis present

## 2015-05-05 DIAGNOSIS — F329 Major depressive disorder, single episode, unspecified: Secondary | ICD-10-CM | POA: Diagnosis not present

## 2015-05-05 DIAGNOSIS — F419 Anxiety disorder, unspecified: Secondary | ICD-10-CM | POA: Diagnosis not present

## 2015-05-05 DIAGNOSIS — Z888 Allergy status to other drugs, medicaments and biological substances status: Secondary | ICD-10-CM | POA: Diagnosis not present

## 2015-05-05 DIAGNOSIS — R55 Syncope and collapse: Secondary | ICD-10-CM | POA: Diagnosis not present

## 2015-05-05 DIAGNOSIS — Z23 Encounter for immunization: Secondary | ICD-10-CM | POA: Diagnosis not present

## 2015-05-05 DIAGNOSIS — J440 Chronic obstructive pulmonary disease with acute lower respiratory infection: Secondary | ICD-10-CM | POA: Diagnosis not present

## 2015-05-05 DIAGNOSIS — J441 Chronic obstructive pulmonary disease with (acute) exacerbation: Secondary | ICD-10-CM | POA: Diagnosis present

## 2015-05-05 LAB — BASIC METABOLIC PANEL
ANION GAP: 12 (ref 5–15)
BUN: 9 mg/dL (ref 6–20)
CHLORIDE: 96 mmol/L — AB (ref 101–111)
CO2: 34 mmol/L — ABNORMAL HIGH (ref 22–32)
Calcium: 9.2 mg/dL (ref 8.9–10.3)
Creatinine, Ser: 0.56 mg/dL (ref 0.44–1.00)
GFR calc Af Amer: >60 mL/min (ref >60)
Glucose, Bld: 117 mg/dL — ABNORMAL HIGH (ref 65–99)
POTASSIUM: 4 mmol/L (ref 3.5–5.1)
SODIUM: 142 mmol/L (ref 135–145)

## 2015-05-05 LAB — CBC WITH DIFFERENTIAL/PLATELET
BASOS PCT: 0 %
Basophils Absolute: 0 10*3/uL (ref 0.0–0.1)
EOS ABS: 0 10*3/uL (ref 0.0–0.7)
Eosinophils Relative: 0 %
HEMATOCRIT: 34.4 % — AB (ref 36.0–46.0)
HEMOGLOBIN: 11.1 g/dL — AB (ref 12.0–15.0)
LYMPHS ABS: 0.6 10*3/uL — AB (ref 0.7–4.0)
Lymphocytes Relative: 10 %
MCH: 29.6 pg (ref 26.0–34.0)
MCHC: 32.3 g/dL (ref 30.0–36.0)
MCV: 91.7 fL (ref 78.0–100.0)
Monocytes Absolute: 0.4 10*3/uL (ref 0.1–1.0)
Monocytes Relative: 7 %
NEUTROS ABS: 4.6 10*3/uL (ref 1.7–7.7)
NEUTROS PCT: 83 %
Platelets: 200 10*3/uL (ref 150–400)
RBC: 3.75 MIL/uL — AB (ref 3.87–5.11)
RDW: 12 % (ref 11.5–15.5)
WBC: 5.6 10*3/uL (ref 4.0–10.5)

## 2015-05-05 LAB — VITAMIN B12: VITAMIN B 12: 237 pg/mL (ref 180–914)

## 2015-05-05 LAB — GLUCOSE, CAPILLARY: GLUCOSE-CAPILLARY: 112 mg/dL — AB (ref 65–99)

## 2015-05-05 LAB — TROPONIN I: Troponin I: <0.03 ng/mL (ref <0.031)

## 2015-05-05 LAB — C-REACTIVE PROTEIN: CRP: 1.3 mg/dL — AB (ref <1.0)

## 2015-05-05 MED ORDER — CLONAZEPAM 0.5 MG PO TABS
0.5000 mg | ORAL_TABLET | Freq: Two times a day (BID) | ORAL | Status: DC | PRN
  Administered 2015-05-06: 0.5 mg via ORAL
  Filled 2015-05-05: qty 1

## 2015-05-05 MED ORDER — AZITHROMYCIN 500 MG PO TABS
500.0000 mg | ORAL_TABLET | Freq: Every day | ORAL | Status: DC
  Administered 2015-05-05 – 2015-05-06 (×2): 500 mg via ORAL
  Filled 2015-05-05 (×2): qty 1

## 2015-05-05 MED ORDER — CYANOCOBALAMIN 1000 MCG/ML IJ SOLN
1000.0000 ug | Freq: Once | INTRAMUSCULAR | Status: AC
  Administered 2015-05-05: 1000 ug via INTRAMUSCULAR
  Filled 2015-05-05: qty 1

## 2015-05-05 MED ORDER — LEVALBUTEROL HCL 1.25 MG/0.5ML IN NEBU
1.2500 mg | INHALATION_SOLUTION | Freq: Three times a day (TID) | RESPIRATORY_TRACT | Status: DC
  Administered 2015-05-05: 1.25 mg via RESPIRATORY_TRACT
  Filled 2015-05-05 (×3): qty 0.5

## 2015-05-05 MED ORDER — OXYCODONE HCL 5 MG PO TABS
5.0000 mg | ORAL_TABLET | Freq: Four times a day (QID) | ORAL | Status: DC | PRN
  Administered 2015-05-05 – 2015-05-06 (×4): 5 mg via ORAL
  Filled 2015-05-05 (×4): qty 1

## 2015-05-05 NOTE — Progress Notes (Addendum)
[facility] PROGRESS NOTE  NOLYN EILERT ZOX:[id number] DOB: 1951/02/08 DOA: 05/04/2015 PCP: Lysle Pearl, MD  Length of stay:    Assessment/Plan: Principal Problem:   Acute encephalopathy Active Problems:   COPD (chronic obstructive pulmonary disease) (HCC)   Right hip pain   Hypercapnia   Anemia   Near syncope   Costochondritis   HPI:  Ms. Banke is a 65 year old female with a has a past medical history of COPD, oxygen dependent on 2 L nasal cannula, continues to smoke half a pack a day, severe anxiety, depression, and arthritis; who presents with complaints of dizziness, chest pain, possible syncopal episode. Patient is a poor historian as she does not recall why she is in the hospital seems to be somewhat confused. Patient husband was contacted but no one answered the phone. She notes that she had a recent fall 3 weeks ago she hit her right breast since that time she complains her equilibrium has been off. Also reports a fall today which she does not know if she hit her head or not while at home and complained of right hip pain. Notes the pain is worse in the right leg with any weight bearing.  Upon admission into the emergency department the patient is evaluated and lab work reveals a hemoglobin 10.6, bicarbonate of 37, troponin negative, UDS positive for benzos and opiates, urinalysis negative. CT of head negative for any acute abnormalities, especially x-ray showing severe emphysema, x-rays of the right hip negative for any acute abnormality, and EKG sinus rhythm.   Assessment and plan Acute toxic  encephalopathy: Patient with generalized confusion and reports of near syncope. Initial evaluation work shows patient to be positive for benzos and opiates as well as seen to be hypercapnic with PCO2 of 67 on blood gas. Likely secondary to a combination of Klonopin, Neurontin, trazodone,Percocet,(also took her husband's vicodin last night )  Syncope/falls Telemetry  uneventful, troponins negative Neurochecks stable, CT head negative, may benefit from MRI of the brain 2-D echo    Acute COPD exacerbation continue Solu-Medrol and ceftriaxone , chest x-ray shows severe emphysema, subacute surgical neck fracture of the right humerus - Continue Budesonide, Brovana, Levalbuterol nebs scheduled - mucinex - Solu-Medrol IV   DC Rocephin and switch to azithromycin by mouth for acute bronchitis   Hypercapnia: PCO2 elevated at 63.7 on admission suspect this probably close to where the patient lives at baseline.  Compounded by benzodiazepines and narcotics   Right hip pain: Status post fall patient complaint right hip pain. Xray of hip shows no acute abnormality. Patient's right hip pain appears to be her primary reason for presenting to the hospital. - Physical therapy to eval and treat - trying to avoid narcotic medications at this time as patient is somewhat confused and altered   Right humeral fracture, impacted, subacute Humeral surgical neck fracture, right, closed,in nov 2016 Patient was seen in ER for this on 03/13/2015 and was supposed to follow-up with outpatient orthopedics Provide sling if needed for pain control   Suspected Costochondritis of the chest wall: Patient complaining of chest pain that is reproducible physical exam. EKG sinus rhythm and initial troponins negative. - Follow-up telemetry overnight - trend troponins  Near syncope - Check orthostatic vital signs  Start IV fluids  Anemia - Continue to monitor  Slightly low vitamin B-12 level as this can lead to gait disturbances     DVT prophylaxsis Lovenox  Code Status:      Code  Status Orders        Start     Ordered   05/04/15 1933  Full code   Continuous     05/04/15 1933       Family Communication: Discussed in detail with the patient, all imaging results, lab results explained to the patient   Disposition Plan:  Anticipate discharge in the next 1-2  days       Consultants: None Procedures:  None  Antibiotics: Anti-infectives    Start     Dose/Rate Route Frequency Ordered Stop   05/04/15 0810  cefTRIAXone (ROCEPHIN) 1 g injection    Comments:  Lonna Duval   : cabinet override      05/04/15 0810 05/04/15 2014   05/04/15 0800  cefTRIAXone (ROCEPHIN) 1 g in dextrose 5 % 50 mL IVPB     1 g 100 mL/hr over 30 Minutes Intravenous  Once 05/04/15 0750 05/04/15 0935         HPI/Subjective: Patient found to have very poor balance and ambulation, even with a rolling walker, hemodynamically stable overnight  Objective: Filed Vitals:   05/04/15 1840 05/04/15 2118 05/05/15 0619 05/05/15 0619  BP: 138/61  140/70   Pulse: 79  94 85  Temp: 98.1 F (36.7 C)  98 F (36.7 C)   TempSrc: Oral  Oral   Resp: 18  16   SpO2: 100% 100%  100%    Intake/Output Summary (Last 24 hours) at 05/05/15 1359 Last data filed at 05/05/15 1320  Gross per 24 hour  Intake    600 ml  Output      0 ml  Net    600 ml    Exam:  General: No acute respiratory distress Lungs: Clear to auscultation bilaterally without wheezes or crackles Cardiovascular: Regular rate and rhythm without murmur gallop or rub normal S1 and S2 Abdomen: Nontender, nondistended, soft, bowel sounds positive, no rebound, no ascites, no appreciable mass Extremities: No significant cyanosis, clubbing, or edema bilateral lower extremities     Data Review   Micro Results No results found for this or any previous visit (from the past 240 hour(s)).  Radiology Reports Dg Chest 2 View  05/04/2015  CLINICAL DATA:  Multiple falls with bruising to the left breast. Dizziness. EXAM: CHEST  2 VIEW COMPARISON:  03/19/2015 FINDINGS: Hyperinflation with emphysematous change and superimposed coarse linear scarring. There is no edema, consolidation, effusion, or pneumothorax (linear densities at the right apex have an artifactual appearance). Biapical nodular densities are best attributed  to EKG pads, also and the left base. Impacted surgical neck fracture of the right humerus with probable partial healing. Alignment is similar to prior. Calcified breast implants. Normal heart size and stable mediastinal contours. IMPRESSION: 1. Severe emphysema with lung scarring. No superimposed acute cardiopulmonary finding. 2. Subacute surgical neck fracture of the right humerus, impacted. Electronically Signed   By: Marnee Spring M.D.   On: 05/04/2015 07:09   Ct Head Wo Contrast  05/04/2015  CLINICAL DATA:  65 year old female with fall.  No head injury. EXAM: CT HEAD WITHOUT CONTRAST TECHNIQUE: Contiguous axial images were obtained from the base of the skull through the vertex without intravenous contrast. COMPARISON:  Brain MRI dated 09/08/2014 FINDINGS: Evaluation of this exam is limited due to motion artifact. The ventricles and sulci are appropriate in size for patient's age. Minimal periventricular and deep white matter hypodensities represent chronic microvascular ischemic changes. There is no intracranial hemorrhage. No mass effect or midline shift identified. The visualized  paranasal sinuses and mastoid air cells are well aerated. The calvarium is intact. IMPRESSION: No acute intracranial pathology. Electronically Signed   By: Elgie Collard M.D.   On: 05/04/2015 06:33   Dg Hip Unilat With Pelvis 2-3 Views Right  05/04/2015  CLINICAL DATA:  65 year old female with fall and right hip pain. 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 There is no acute fracture or dislocation. Caps the bones are mildly osteopenic. The soft tissues are grossly unremarkable. IMPRESSION: No acute fracture or dislocation. Electronically Signed   By: Elgie Collard M.D.   On: 05/04/2015 06:36     CBC  Recent Labs Lab 05/04/15 0520 05/05/15 0121  WBC 5.0 5.6  HGB 10.6* 11.1*  HCT 34.3* 34.4*  PLT  161 200  MCV 96.1 91.7  MCH 29.7 29.6  MCHC 30.9 32.3  RDW 11.2* 12.0  LYMPHSABS 0.9 0.6*  MONOABS 0.5 0.4  EOSABS 0.1 0.0  BASOSABS 0.0 0.0    Chemistries   Recent Labs Lab 05/04/15 0520 05/05/15 0121  NA 142 142  K 3.5 4.0  CL 100* 96*  CO2 37* 34*  GLUCOSE 102* 117*  BUN 11 9  CREATININE 0.49 0.56  CALCIUM 8.5* 9.2   ------------------------------------------------------------------------------------------------------------------ CrCl cannot be calculated (Unknown ideal weight.). ------------------------------------------------------------------------------------------------------------------ No results for input(s): HGBA1C in the last 72 hours. ------------------------------------------------------------------------------------------------------------------ No results for input(s): CHOL, HDL, LDLCALC, TRIG, CHOLHDL, LDLDIRECT in the last 72 hours. ------------------------------------------------------------------------------------------------------------------ No results for input(s): TSH, T4TOTAL, T3FREE, THYROIDAB in the last 72 hours.  Invalid input(s): FREET3 ------------------------------------------------------------------------------------------------------------------  Recent Labs  05/05/15 0121  VITAMINB12 237    Coagulation profile No results for input(s): INR, PROTIME in the last 168 hours.  No results for input(s): DDIMER in the last 72 hours.  Cardiac Enzymes  Recent Labs Lab 05/04/15 2028 05/04/15 2330 05/05/15 0121  TROPONINI <0.03 <0.03 <0.03   ------------------------------------------------------------------------------------------------------------------ Invalid input(s): POCBNP   CBG:  Recent Labs Lab 05/05/15 1151  GLUCAP 112*       Studies: Dg Chest 2 View  05/04/2015  CLINICAL DATA:  Multiple falls with bruising to the left breast. Dizziness. EXAM: CHEST  2 VIEW COMPARISON:  03/19/2015 FINDINGS: Hyperinflation with  emphysematous change and superimposed coarse linear scarring. There is no edema, consolidation, effusion, or pneumothorax (linear densities at the right apex have an artifactual appearance). Biapical nodular densities are best attributed to EKG pads, also and the left base. Impacted surgical neck fracture of the right humerus with probable partial healing. Alignment is similar to prior. Calcified breast implants. Normal heart size and stable mediastinal contours. IMPRESSION: 1. Severe emphysema with lung scarring. No superimposed acute cardiopulmonary finding. 2. Subacute surgical neck fracture of the right humerus, impacted. Electronically Signed   By: Marnee Spring M.D.   On: 05/04/2015 07:09   Ct Head Wo Contrast  05/04/2015  CLINICAL DATA:  65 year old female with fall.  No head injury. EXAM: CT HEAD WITHOUT CONTRAST TECHNIQUE: Contiguous axial images were obtained from the base of the skull through the vertex without intravenous contrast. COMPARISON:  Brain MRI dated 09/08/2014 FINDINGS: Evaluation of this exam is limited due to motion artifact. The ventricles and sulci are appropriate in size for patient's age. Minimal periventricular and deep white matter hypodensities represent chronic microvascular ischemic changes. There is no intracranial hemorrhage. No mass effect or midline shift identified. The visualized paranasal sinuses and mastoid air cells are well aerated.  The calvarium is intact. IMPRESSION: No acute intracranial pathology. Electronically Signed   By: Elgie Collard M.D.   On: 05/04/2015 06:33   Dg Hip Unilat With Pelvis 2-3 Views Right  05/04/2015  CLINICAL DATA:  65 year old female with fall and right hip pain. 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 There is no acute fracture or dislocation. Caps the bones are mildly osteopenic. The soft tissues are grossly  unremarkable. IMPRESSION: No acute fracture or dislocation. Electronically Signed   By: Elgie Collard M.D.   On: 05/04/2015 06:36      No results found for: HGBA1C Lab Results  Component Value Date   CREATININE 0.56 05/05/2015       Scheduled Meds: . arformoterol  15 mcg Nebulization BID  . budesonide (PULMICORT) nebulizer solution  0.5 mg Nebulization BID  . cholecalciferol  1,000 Units Oral Daily  . enoxaparin (LOVENOX) injection  40 mg Subcutaneous Q24H  . gabapentin  100 mg Oral QHS  . guaiFENesin  600 mg Oral BID  . levalbuterol  1.25 mg Nebulization TID  . methylPREDNISolone (SOLU-MEDROL) injection  40 mg Intravenous Q12H  . sodium chloride  3 mL Intravenous Q12H  . traZODone  100 mg Oral QHS   Continuous Infusions:   Principal Problem:   Acute encephalopathy Active Problems:   COPD (chronic obstructive pulmonary disease) (HCC)   Right hip pain   Hypercapnia   Anemia   Near syncope   Costochondritis    Time spent: 45 minutes   [facility]  [facility] Pager [phone]. If 7PM-7AM, please contact night-coverage at [email or web address], password [facility] 05/05/2015, 1:59 PM

## 2015-05-05 NOTE — Evaluation (Signed)
Physical Therapy Evaluation Patient Details Name: Erin Lozano MRN: [id number] DOB: 10-20-1950 Today's Date: 05/05/2015   History of Present Illness  Erin Lozano is a 65 year old female with a has a past medical history of COPD, oxygen dependent on 2 L nasal cannula, severe anxiety, depression, and arthritis; who presents with complaints of dizziness, chest pain, possible syncopal episode. Pt fell at home and c/o right hip pain, x-rays neg for fx. Pt also with recent h/o fall with RUE fx. CT head neg   Clinical Impression  Pt admitted with above diagnosis. Pt currently with functional limitations due to the deficits listed below (see PT Problem List). Pt very weak and with poor balance, ambulated 20' with HHA to begin and then RW due to the need for additional support.  Pt will benefit from skilled PT to increase their independence and safety with mobility to allow discharge to the venue listed below.       Follow Up Recommendations Home health PT;Supervision for mobility/OOB    Equipment Recommendations  None recommended by PT    Recommendations for Other Services       Precautions / Restrictions Precautions Precautions: Fall Restrictions Weight Bearing Restrictions: No      Mobility  Bed Mobility Overal bed mobility: Modified Independent             General bed mobility comments: pt able to get in and out of bed with increased time and use of rail  Transfers Overall transfer level: Needs assistance Equipment used: 1 person hand held assist Transfers: Sit to/from Stand Sit to Stand: Min assist         General transfer comment: pt stood with increased time and min A to support, pt denies dizziness, stands with bilateral knees flexed   Ambulation/Gait Ambulation/Gait assistance: Min assist Ambulation Distance (Feet): 20 Feet Assistive device: Rolling walker (2 wheeled);1 person hand held assist Gait Pattern/deviations: Step-through pattern;Shuffle;Decreased stride  length Gait velocity: decreased Gait velocity interpretation: <1.8 ft/sec, indicative of risk for recurrent falls General Gait Details: pt began ambulation with HHA but was very flexed, legs shaking and knee flexed. Pt was given RW for additional support (her husband reports she has been cleared to use one from the standpoint of right UE fx). Increased safety noted but continued weakness and fatigue and unability to ambulate more than 20'  Stairs            Wheelchair Mobility    Modified Rankin (Stroke Patients Only)       Balance Overall balance assessment: Needs assistance Sitting-balance support: No upper extremity supported Sitting balance-Leahy Scale: Fair     Standing balance support: Single extremity supported Standing balance-Leahy Scale: Poor Standing balance comment: very unsteady in standing, requires support to maintain balance in static and dynamic standing                             Pertinent Vitals/Pain Pain Assessment: Faces Faces Pain Scale: Hurts little more Pain Location: headache and c/o LE discomfort that she describes more as being at the knee than the hip  Pain Descriptors / Indicators: Aching Pain Intervention(s): Limited activity within patient's tolerance;Monitored during session        O2 sats 97% on 2L O2  Home Living Family/patient expects to be discharged to:: Private residence Living Arrangements: Spouse/significant other Available Help at Discharge: Family;Available 24 hours/day Type of Home: House Home Access: Stairs to enter   [employer] of  Steps: 4   Home Equipment: Walker - 2 wheels;Gilmer Mor - single point Additional Comments: pt's husband reports that she has fallen 6x since her fall at Thanksgiving. Reports that she is very off balance and weak.     Prior Function Level of Independence: Independent with assistive device(s)         Comments: husband reports he has had to assist wife with daily activities  since November     Hand Dominance        Extremity/Trunk Assessment   Upper Extremity Assessment: Generalized weakness           Lower Extremity Assessment: Generalized weakness      Cervical / Trunk Assessment: Kyphotic  Communication   Communication: No difficulties  Cognition Arousal/Alertness: Awake/alert Behavior During Therapy: WFL for tasks assessed/performed Overall Cognitive Status: Impaired/Different from baseline Area of Impairment: Memory     Memory: Decreased short-term memory         General Comments: pt is poor historian, very difficult to follow her timeline and she lists complaints that she has, some that are recent and some new without explaining    General Comments      Exercises General Exercises - Lower Extremity Ankle Circles/Pumps: AROM;Both;10 reps;Seated Long Arc Quad: AROM;Both;5 reps;Seated Hip Flexion/Marching: AROM;Both;5 reps;Seated      Assessment/Plan    PT Assessment Patient needs continued PT services  PT Diagnosis Difficulty walking;Generalized weakness;Abnormality of gait   PT Problem List Decreased strength;Decreased activity tolerance;Decreased balance;Decreased mobility;Decreased cognition;Decreased knowledge of use of DME;Decreased knowledge of precautions  PT Treatment Interventions DME instruction;Gait training;Stair training;Functional mobility training;Therapeutic activities;Balance training;Therapeutic exercise;Patient/family education   PT Goals (Current goals can be found in the Care Plan section) Acute Rehab PT Goals Patient Stated Goal: get stronger PT Goal Formulation: With patient Time For Goal Achievement: 05/19/15 Potential to Achieve Goals: Good    Frequency Min 3X/week   Barriers to discharge        Co-evaluation               End of Session Equipment Utilized During Treatment: Gait belt;Oxygen Activity Tolerance: Patient tolerated treatment well;Patient limited by fatigue Patient left:  in bed;with call bell/phone within reach;with family/visitor present Nurse Communication: Mobility status    Functional Assessment Tool Used: clinical judgement Functional Limitation: Mobility: Walking and moving around Mobility: Walking and Moving Around Current Status (Z6109): At least 20 percent but less than 40 percent impaired, limited or restricted Mobility: Walking and Moving Around Goal Status [phone]): At least 1 percent but less than 20 percent impaired, limited or restricted    Time: 1201-1227 PT Time Calculation (min) (ACUTE ONLY): 26 min   Charges:   PT Evaluation $PT Eval Moderate Complexity: 1 Procedure PT Treatments $Gait Training: 8-22 mins   PT G Codes:   PT G-Codes **NOT FOR INPATIENT CLASS** Functional Assessment Tool Used: clinical judgement Functional Limitation: Mobility: Walking and moving around Mobility: Walking and Moving Around Current Status (U9811): At least 20 percent but less than 40 percent impaired, limited or restricted Mobility: Walking and Moving Around Goal Status [phone]): At least 1 percent but less than 20 percent impaired, limited or restricted   Lyanne Co, PT  Acute Rehab Services  [phone]  Lyanne Co 05/05/2015, 1:58 PM

## 2015-05-05 NOTE — Care Management Obs Status (Signed)
MEDICARE OBSERVATION STATUS NOTIFICATION   Patient Details  Name: Erin Lozano MRN: [id number] Date of Birth: February 10, 1951   Medicare Observation Status Notification Given:  Yes    Lawerance Sabal, RN 05/05/2015, 10:53 AM

## 2015-05-05 NOTE — Progress Notes (Signed)
Orthopedic Tech Progress Note Patient Details:  Erin Lozano 1950/09/30 [id number]  Ortho Devices Type of Ortho Device: Sling immobilizer Ortho Device/Splint Location: rue Ortho Device/Splint Interventions: Application   Nikki Dom 05/05/2015, 3:38 PM

## 2015-05-05 NOTE — Care Management Note (Addendum)
Case Management Note  Patient Details  Name: Erin Lozano MRN: [id number] Date of Birth: 1950-09-17  Subjective/Objective:                  Date- 05-05-15 Initial Assessment Spoke with patient at the bedside along with husband.  Introduced self as Sports coach and explained role in discharge planning and how to be reached.  Verified patient lives in  [place] in a home with husband.  Verified patient anticipates to go home with spouse at time of discharge and will have full time supervision by husband at this time to best of their knowledge.  Patient has DME cane walker wheelchair oxygen through [facility]. Expressed potential need for no other DME.  Patient confirmed needing help only with a psych medication that neither she nor her husband could remember the name of. CM asked patient to notify us if she could remember. Patient and husband drive to MD appointments.  Verified patient has PCP [place] Patient states they currently receive HH services through no one. Patient would like to use [facility] if [facility] services are needed.  Admission Comments: Admitted with acute confusion, seems resolved as patient is answering questions appropriately.   Lawerance Sabal RN BSN CM [phone]   Action/Plan:  Anticipate DC to home. Referral made to [facility] for [facility] PT, patient requested [facility] for PT.  Expected Discharge Date:                  Expected Discharge Plan:  Home/Self Care  In-House Referral:     Discharge planning Services  CM Consult  Post Acute Care Choice:    Choice offered to:     DME Arranged:    DME Agency:     HH Arranged:    HH Agency:     Status of Service:  In process, will continue to follow  Medicare Important Message Given:    Date Medicare IM Given:    Medicare IM give by:    Date Additional Medicare IM Given:    Additional Medicare Important Message give by:     If discussed at Long Length of Stay Meetings, dates discussed:    Additional Comments:  Lawerance Sabal,  RN 05/05/2015, 10:54 AM

## 2015-05-06 LAB — COMPREHENSIVE METABOLIC PANEL
ALBUMIN: 3.3 g/dL — AB (ref 3.5–5.0)
ALK PHOS: 56 U/L (ref 38–126)
ALT: 8 U/L — AB (ref 14–54)
AST: 16 U/L (ref 15–41)
Anion gap: 7 (ref 5–15)
BUN: 14 mg/dL (ref 6–20)
CALCIUM: 8.8 mg/dL — AB (ref 8.9–10.3)
CO2: 31 mmol/L (ref 22–32)
CREATININE: 0.59 mg/dL (ref 0.44–1.00)
Chloride: 101 mmol/L (ref 101–111)
GFR calc non Af Amer: >60 mL/min (ref >60)
GLUCOSE: 122 mg/dL — AB (ref 65–99)
Potassium: 4 mmol/L (ref 3.5–5.1)
SODIUM: 139 mmol/L (ref 135–145)
Total Bilirubin: 0.4 mg/dL (ref 0.3–1.2)
Total Protein: 5.1 g/dL — ABNORMAL LOW (ref 6.5–8.1)

## 2015-05-06 LAB — CBC
HEMATOCRIT: 33.2 % — AB (ref 36.0–46.0)
HEMOGLOBIN: 10.8 g/dL — AB (ref 12.0–15.0)
MCH: 30.3 pg (ref 26.0–34.0)
MCHC: 32.5 g/dL (ref 30.0–36.0)
MCV: 93.3 fL (ref 78.0–100.0)
Platelets: 163 10*3/uL (ref 150–400)
RBC: 3.56 MIL/uL — AB (ref 3.87–5.11)
RDW: 12.2 % (ref 11.5–15.5)
WBC: 3.8 10*3/uL — ABNORMAL LOW (ref 4.0–10.5)

## 2015-05-06 MED ORDER — CYANOCOBALAMIN 2000 MCG PO TABS: 2000.0000 ug | ORAL_TABLET | Freq: Every day | ORAL | Status: AC

## 2015-05-06 MED ORDER — AZITHROMYCIN 500 MG PO TABS: 500.0000 mg | ORAL_TABLET | Freq: Every day | ORAL | Status: AC

## 2015-05-06 MED ORDER — PREDNISONE 10 MG PO TABS: ORAL_TABLET | ORAL | Status: AC

## 2015-05-06 NOTE — Care Management Note (Signed)
Case Management Note  Patient Details  Name: Erin Lozano MRN: [id number] Date of Birth: May 11, 1950  Subjective/Objective:                  Date- 05-05-15 Initial Assessment Spoke with patient at the bedside along with husband.  Introduced self as Sports coach and explained role in discharge planning and how to be reached.  Verified patient lives in [place] in a home with husband.  Verified patient anticipates to go home with spouse at time of discharge and will have full time supervision by husband at this time to best of their knowledge.  Patient has DME cane walker wheelchair oxygen through [facility]. Expressed potential need for no other DME.  Patient confirmed needing help only with a psych medication that neither she nor her husband could remember the name of. CM asked patient to notify us if she could remember. Patient and husband drive to MD appointments.  Verified patient has PCP [place] Patient states they currently receive HH services through no one. Patient would like to use [facility] if [facility] services are needed.  Admission Comments: Admitted with acute confusion, seems resolved as patient is answering questions appropriately.   Lawerance Sabal RN BSN CM [phone]   Action/Plan: Anticipate DC to home. Referral made to [facility] for [facility] PT OT RN [facility] patient requested [facility] for PT.   Action/Plan:   Expected Discharge Date:                  Expected Discharge Plan:  Home/Self Care  In-House Referral:     Discharge planning Services  CM Consult  Post Acute Care Choice:    Choice offered to:     DME Arranged:    DME Agency:     HH Arranged:  PT, OT, Nurse's Aide, RN HH Agency:  [facility]  Status of Service:  Completed, signed off  Medicare Important Message Given:    Date Medicare IM Given:    Medicare IM give by:    Date Additional Medicare IM Given:    Additional Medicare Important Message give by:     If discussed at Long Length of Stay  Meetings, dates discussed:    Additional Comments:  Lawerance Sabal, RN 05/06/2015, 1:40 PM

## 2015-05-06 NOTE — Progress Notes (Signed)
05/06/15 Patient discharged home, IV site removed, Telemetry removed, and Discharge instructions reviewed with patient.

## 2015-05-06 NOTE — Discharge Summary (Addendum)
Physician Discharge Summary  Erin Lozano MRN: [id number] DOB/AGE: 65-Feb-1952 65 y.o.  PCP: Elijio Miles, MD   Admit date: 05/04/2015 Discharge date: 05/06/2015  Discharge Diagnoses:     Principal Problem:   Acute encephalopathy Active Problems:   COPD (chronic obstructive pulmonary disease) (HCC)   Right hip pain   Hypercapnia   Anemia   Near syncope   Costochondritis    Follow-up recommendations Follow-up with PCP in 3-5 days , including all  additional recommended appointments as below Follow-up CBC, CMP in 3-5 days  Patient advised not to use her husband's narcotic prescriptions, providers  requested to minimize narcotic medications in the setting of her COPD    Medication List    TAKE these medications        albuterol 108 (90 Base) MCG/ACT inhaler  Commonly known as:  PROVENTIL HFA;VENTOLIN HFA  Inhale 1 puff into the lungs every 6 (six) hours as needed for wheezing.     azithromycin 500 MG tablet  Commonly known as:  ZITHROMAX  Take 1 tablet (500 mg total) by mouth daily.     bismuth subsalicylate 725 DG/64QI suspension  Commonly known as:  PEPTO BISMOL  Take 15 mLs by mouth every 6 (six) hours as needed for indigestion.     budesonide-formoterol 160-4.5 MCG/ACT inhaler  Commonly known as:  SYMBICORT  Inhale 2 puffs into the lungs 2 (two) times daily.     clonazePAM 1 MG tablet  Commonly known as:  KLONOPIN  Take 1 mg by mouth 2 (two) times daily.     gabapentin 100 MG capsule  Commonly known as:  NEURONTIN  Take 100 mg by mouth at bedtime.     levalbuterol 1.25 MG/3ML nebulizer solution  Commonly known as:  XOPENEX  Take 1.25 mg by nebulization every 4 (four) hours as needed for wheezing.     MUCINEX PO  Take 15 mLs by mouth at bedtime as needed (cough).     oxyCODONE-acetaminophen 5-325 MG tablet  Commonly known as:  PERCOCET/ROXICET  Take 1-2 tablets by mouth every 4 (four) hours as needed.     OXYGEN  Inhale into the lungs  continuous. 2L     predniSONE 10 MG tablet  Commonly known as:  DELTASONE  6 tablets, 3 days, 5 tablets, 3 days , 4 tabs  3 days, 3 tablets, 3 views, 2 tablets, 3 days, 1 tablet, 3 days, half tablet, 3 days then discontinue     traZODone 100 MG tablet  Commonly known as:  DESYREL  Take 100 mg by mouth at bedtime.     VITAMIN D PO  Take 1 tablet by mouth daily.         Discharge Condition: Stable Discharge Instructions       Discharge Instructions    Diet - low sodium heart healthy    Complete by:  As directed      Increase activity slowly    Complete by:  As directed            Allergies  Allergen Reactions  . Contrast Media [Iodinated Diagnostic Agents] Hives  . Seroquel [Quetiapine Fumarate] Other (See Comments)    hallucinations      Disposition: 01-Home or Self Care   Consults: None  Significant Diagnostic Studies:  Dg Chest 2 View  05/04/2015  CLINICAL DATA:  Multiple falls with bruising to the left breast. Dizziness. EXAM: CHEST  2 VIEW COMPARISON:  03/19/2015 FINDINGS: Hyperinflation with emphysematous change and superimposed coarse linear  scarring. There is no edema, consolidation, effusion, or pneumothorax (linear densities at the right apex have an artifactual appearance). Biapical nodular densities are best attributed to EKG pads, also and the left base. Impacted surgical neck fracture of the right humerus with probable partial healing. Alignment is similar to prior. Calcified breast implants. Normal heart size and stable mediastinal contours. IMPRESSION: 1. Severe emphysema with lung scarring. No superimposed acute cardiopulmonary finding. 2. Subacute surgical neck fracture of the right humerus, impacted. Electronically Signed   By: Monte Fantasia M.D.   On: 05/04/2015 07:09   Ct Head Wo Contrast  05/04/2015  CLINICAL DATA:  65 year old female with fall.  No head injury. EXAM: CT HEAD WITHOUT CONTRAST TECHNIQUE: Contiguous axial images were obtained  from the base of the skull through the vertex without intravenous contrast. COMPARISON:  Brain MRI dated 09/08/2014 FINDINGS: Evaluation of this exam is limited due to motion artifact. The ventricles and sulci are appropriate in size for patient's age. Minimal periventricular and deep white matter hypodensities represent chronic microvascular ischemic changes. There is no intracranial hemorrhage. No mass effect or midline shift identified. The visualized paranasal sinuses and mastoid air cells are well aerated. The calvarium is intact. IMPRESSION: No acute intracranial pathology. Electronically Signed   By: Anner Crete M.D.   On: 05/04/2015 06:33   Mr Brain Wo Contrast  05/05/2015  CLINICAL DATA:  Recent fall, confusion, altered mental status. Polypharmacy with Klonopin, Neurontin, trazodone, Percocet, and Vicodin. EXAM: MRI HEAD WITHOUT CONTRAST TECHNIQUE: Multiplanar, multiecho pulse sequences of the brain and surrounding structures were obtained without intravenous contrast. COMPARISON:  CT head 05/04/2015. FINDINGS: No evidence for acute infarction, hemorrhage, mass lesion, hydrocephalus, or extra-axial fluid. Mild cerebral and cerebellar atrophy. Mild subcortical and periventricular T2 and FLAIR hyperintensities, likely chronic microvascular ischemic change. Normal midline structures. No tonsillar herniation. Flow voids are maintained. No foci of chronic hemorrhage. Extracranial soft tissues unremarkable. IMPRESSION: No acute intracranial findings. No posttraumatic sequelae are evident. Mild atrophy and small vessel disease. Electronically Signed   By: Staci Righter M.D.   On: 05/05/2015 18:39   Dg Hip Unilat With Pelvis 2-3 Views Right  05/04/2015  CLINICAL DATA:  65 year old female with fall and right hip pain. 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 There is no acute fracture or  dislocation. Caps the bones are mildly osteopenic. The soft tissues are grossly unremarkable. IMPRESSION: No acute fracture or dislocation. Electronically Signed   By: Anner Crete M.D.   On: 05/04/2015 06:36     Echocardiogram LV EF: 55% -  60%  ------------------------------------------------------------------- Indications:   Chest pain 786.51.  ------------------------------------------------------------------- History:  PMH:  Chronic obstructive pulmonary disease. Risk factors: Near syncope. Current tobacco use.  ------------------------------------------------------------------- Study Conclusions  - Left ventricle: The cavity size was normal. Wall thickness was normal. Systolic function was normal. The estimated ejection fraction was in the range of 55% to 60%. Wall motion was normal; there were no regional wall motion abnormalities. Doppler parameters are consistent with abnormal left ventricular relaxation (grade 1 diastolic dysfunction).  Impressions:  - Normal LV systolic function; grade 1 diastolic dysfunction; trace TR.   There were no vitals filed for this visit.   Microbiology: No results found for this or any previous visit (from the past 240 hour(s)).     Blood Culture    Component Value Date/Time   SDES URINE, CLEAN CATCH 12/05/2012 1044  SPECREQUEST NONE 12/05/2012 1044   CULT  12/04/2012 1245    NO GROWTH 5 DAYS Performed at [facility] 12/06/2012 FINAL 12/05/2012 1044      Labs: Results for orders placed or performed during the hospital encounter of 05/04/15 (from the past 48 hour(s))  Troponin I-serum (0, 3, 6 hours)     Status: None   Collection Time: 05/04/15  8:28 PM  Result Value Ref Range   Troponin I <0.03 <0.031 ng/mL    Comment:        NO INDICATION OF MYOCARDIAL INJURY.   Troponin I-serum (0, 3, 6 hours)     Status: None   Collection Time: 05/04/15 11:30 PM  Result Value Ref  Range   Troponin I <0.03 <0.031 ng/mL    Comment:        NO INDICATION OF MYOCARDIAL INJURY.   C-reactive protein     Status: Abnormal   Collection Time: 05/04/15 11:30 PM  Result Value Ref Range   CRP 1.3 (H) <1.0 mg/dL  Troponin I-serum (0, 3, 6 hours)     Status: None   Collection Time: 05/05/15  1:21 AM  Result Value Ref Range   Troponin I <0.03 <0.031 ng/mL    Comment:        NO INDICATION OF MYOCARDIAL INJURY.   CBC with Differential/Platelet     Status: Abnormal   Collection Time: 05/05/15  1:21 AM  Result Value Ref Range   WBC 5.6 4.0 - 10.5 K/uL   RBC 3.75 (L) 3.87 - 5.11 MIL/uL   Hemoglobin 11.1 (L) 12.0 - 15.0 g/dL   HCT 34.4 (L) 36.0 - 46.0 %   MCV 91.7 78.0 - 100.0 fL   MCH 29.6 26.0 - 34.0 pg   MCHC 32.3 30.0 - 36.0 g/dL   RDW 12.0 11.5 - 15.5 %   Platelets 200 150 - 400 K/uL   Neutrophils Relative % 83 %   Neutro Abs 4.6 1.7 - 7.7 K/uL   Lymphocytes Relative 10 %   Lymphs Abs 0.6 (L) 0.7 - 4.0 K/uL   Monocytes Relative 7 %   Monocytes Absolute 0.4 0.1 - 1.0 K/uL   Eosinophils Relative 0 %   Eosinophils Absolute 0.0 0.0 - 0.7 K/uL   Basophils Relative 0 %   Basophils Absolute 0.0 0.0 - 0.1 K/uL  Basic metabolic panel     Status: Abnormal   Collection Time: 05/05/15  1:21 AM  Result Value Ref Range   Sodium 142 135 - 145 mmol/L   Potassium 4.0 3.5 - 5.1 mmol/L   Chloride 96 (L) 101 - 111 mmol/L   CO2 34 (H) 22 - 32 mmol/L   Glucose, Bld 117 (H) 65 - 99 mg/dL   BUN 9 6 - 20 mg/dL   Creatinine, Ser 0.56 0.44 - 1.00 mg/dL   Calcium 9.2 8.9 - 10.3 mg/dL   GFR calc non Af Amer >60 >60 mL/min   GFR calc Af Amer >60 >60 mL/min    Comment: (NOTE) The eGFR has been calculated using the CKD EPI equation. This calculation has not been validated in all clinical situations. eGFR's persistently <60 mL/min signify possible Chronic Kidney Disease.    Anion gap 12 5 - 15  Vitamin B12     Status: None   Collection Time: 05/05/15  1:21 AM  Result Value Ref  Range   Vitamin B-12 237 180 - 914 pg/mL    Comment: (NOTE) This assay is not  validated for testing neonatal or myeloproliferative syndrome specimens for Vitamin B12 levels.   Glucose, capillary     Status: Abnormal   Collection Time: 05/05/15 11:51 AM  Result Value Ref Range   Glucose-Capillary 112 (H) 65 - 99 mg/dL  CBC     Status: Abnormal   Collection Time: 05/06/15  5:25 AM  Result Value Ref Range   WBC 3.8 (L) 4.0 - 10.5 K/uL   RBC 3.56 (L) 3.87 - 5.11 MIL/uL   Hemoglobin 10.8 (L) 12.0 - 15.0 g/dL   HCT 33.2 (L) 36.0 - 46.0 %   MCV 93.3 78.0 - 100.0 fL   MCH 30.3 26.0 - 34.0 pg   MCHC 32.5 30.0 - 36.0 g/dL   RDW 12.2 11.5 - 15.5 %   Platelets 163 150 - 400 K/uL  Comprehensive metabolic panel     Status: Abnormal   Collection Time: 05/06/15  5:25 AM  Result Value Ref Range   Sodium 139 135 - 145 mmol/L   Potassium 4.0 3.5 - 5.1 mmol/L   Chloride 101 101 - 111 mmol/L   CO2 31 22 - 32 mmol/L   Glucose, Bld 122 (H) 65 - 99 mg/dL   BUN 14 6 - 20 mg/dL   Creatinine, Ser 0.59 0.44 - 1.00 mg/dL   Calcium 8.8 (L) 8.9 - 10.3 mg/dL   Total Protein 5.1 (L) 6.5 - 8.1 g/dL   Albumin 3.3 (L) 3.5 - 5.0 g/dL   AST 16 15 - 41 U/L   ALT 8 (L) 14 - 54 U/L   Alkaline Phosphatase 56 38 - 126 U/L   Total Bilirubin 0.4 0.3 - 1.2 mg/dL   GFR calc non Af Amer >60 >60 mL/min   GFR calc Af Amer >60 >60 mL/min    Comment: (NOTE) The eGFR has been calculated using the CKD EPI equation. This calculation has not been validated in all clinical situations. eGFR's persistently <60 mL/min signify possible Chronic Kidney Disease.    Anion gap 7 5 - 15     Lipid Panel  No results found for: CHOL, TRIG, HDL, CHOLHDL, VLDL, LDLCALC, LDLDIRECT   No results found for: HGBA1C   Lab Results  Component Value Date   CREATININE 0.59 05/06/2015     Erin Lozano is a 65 year old female with a has a past medical history of COPD, oxygen dependent on 2 L nasal cannula, continues to smoke half a pack  a day, severe anxiety, depression, and arthritis; who presents with complaints of dizziness, chest pain, possible syncopal episode. Patient is a poor historian as she does not recall why she is in the hospital seems to be somewhat confused. Patient husband was contacted but no one answered the phone. She notes that she had a recent fall 3 weeks ago she hit her right breast since that time she complains her equilibrium has been off. Also reports a fall today which she does not know if she hit her head or not while at home and complained of right hip pain. Notes the pain is worse in the right leg with any weight bearing.  Upon admission into the emergency department the patient is evaluated and lab work reveals a hemoglobin 10.6, bicarbonate of 37, troponin negative, UDS positive for benzos and opiates, urinalysis negative. CT of head negative for any acute abnormalities, especially x-ray showing severe emphysema, x-rays of the right hip negative for any acute abnormality, and EKG sinus rhythm.   Assessment and plan Acute toxic encephalopathy: Patient with  generalized confusion and reports of near syncope. Initial evaluation work shows patient to be positive for benzos and opiates as well as seen to be hypercapnic with PCO2 of 67 on blood gas. Likely secondary to a combination of Klonopin, Neurontin, trazodone,Percocet,(also took her husband's vicodin last night ) Symptoms completely resolved at the time of discharge   Syncope/falls Telemetry uneventful, troponins negative Neurochecks stable, CT head negative, MRI of the brain with results as above, negative, 2-D echo within normal limits, results as above   Acute COPD exacerbation  Initiated on IV Solu-Medrol and ceftriaxone/azithromycin , chest x-ray shows severe emphysema, subacute surgical neck fracture of the right humerus - Continue Budesonide, Brovana, Levalbuterol nebs scheduled - mucinex - Solu-Medrol IV switched to by mouth  prednisone taper DC Rocephin and switch to azithromycin by mouth for acute bronchitis 5 days   Hypercapnia: PCO2 elevated at 63.7 on admission suspect this probably close to where the patient lives at baseline.  Compounded by benzodiazepines and narcotics   Right hip pain: Status post fall patient complaint right hip pain. Xray of hip shows no acute abnormality. Patient's right hip pain appears to be her primary reason for presenting to the hospital. - Physical therapy to eval and treat, recommending home health Minimize narcotic medications long-term    Right humeral fracture, impacted, subacute Humeral surgical neck fracture, right, closed,in nov 2016 Patient was seen in ER for this on 03/13/2015 and was supposed to follow-up with outpatient orthopedics Patient provided with a sling     Suspected Costochondritis of the chest wall: Patient complaining of chest pain that is reproducible physical exam. EKG sinus rhythm and initial troponins negative. - Follow-up telemetry overnight Negative cardiac enzymes   Near syncope - Check orthostatic vital signs  Resolved with IV fluids  Anemia - Continue to monitor  Slightly low vitamin B-12 level as this can lead to gait disturbances  Provided with vitamin B-12 supplementation     Discharge Exam:    Blood pressure 132/69, pulse 69, temperature 97.7 F (36.5 C), temperature source Oral, resp. rate 18, SpO2 100 %. General: No acute respiratory distress Lungs: Clear to auscultation bilaterally without wheezes or crackles Cardiovascular: Regular rate and rhythm without murmur gallop or rub normal S1 and S2 Abdomen: Nontender, nondistended, soft, bowel sounds positive, no rebound, no ascites, no appreciable mass Extremities: No significant cyanosis, clubbing, or edema bilateral lower extremities     Follow-up Information    Follow up with Elijio Miles, MD.   Specialty:  Physician Assistant   Contact information:    [address] [place] [address] [phone]       Follow up with Elijio Miles, MD. Schedule an appointment as soon as possible for a visit in 1 week.   Specialty:  Physician Assistant   Contact information:   [address] [place] [address] [phone]       Signed: Reyne Dumas 05/06/2015, 1:21 PM        Time spent >45 mins

## 2017-07-11 IMAGING — CT CT HEAD W/O CM
2 of 3 series · 15 of 30 positions shown, 17 images · non-contrast
Comparison: Brain MRI dated 09/08/2014

CLINICAL DATA: 64-year-old female with fall.  No head injury.

EXAM:
CT HEAD WITHOUT CONTRAST
TECHNIQUE: Contiguous axial images were obtained from the base of the skull
through the vertex without intravenous contrast.

[Series 2: head wo · axial · 0.44mm/px · z∈[-132,-25]mm · 7 of 30 slices shown, 9 images]
[im 4/30  brain]
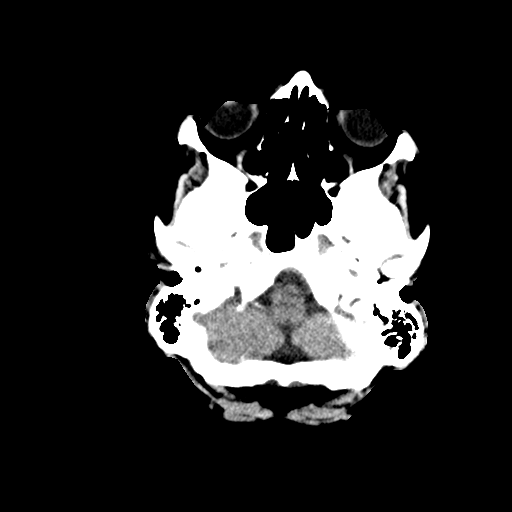
[im 4/30  bone]
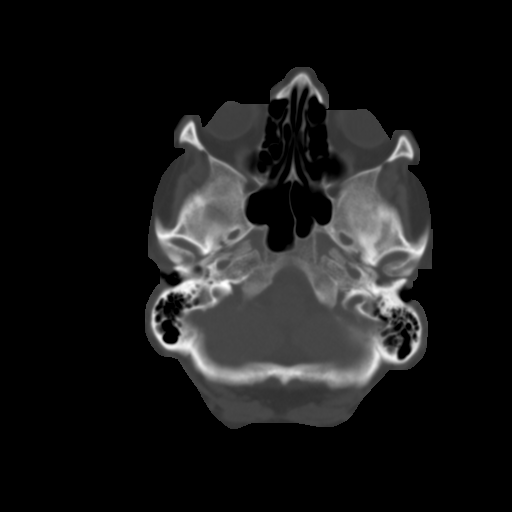
[im 8/30  brain]
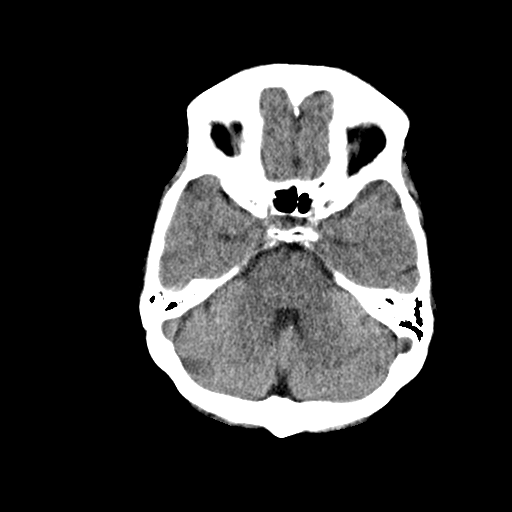
[im 11/30  brain]
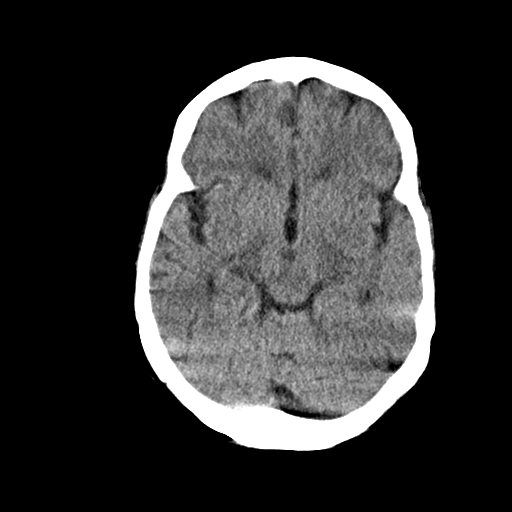
[im 15/30  brain]
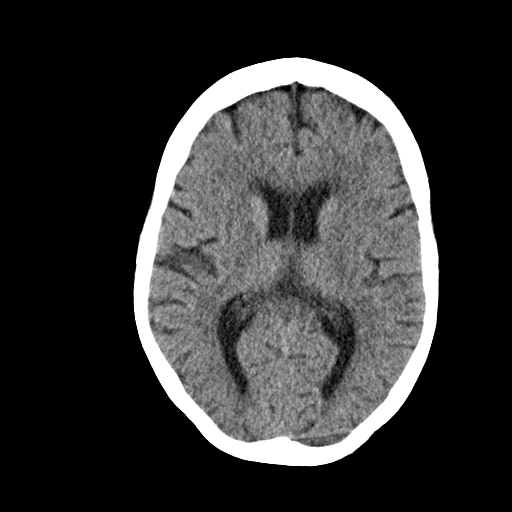
[im 19/30  brain]
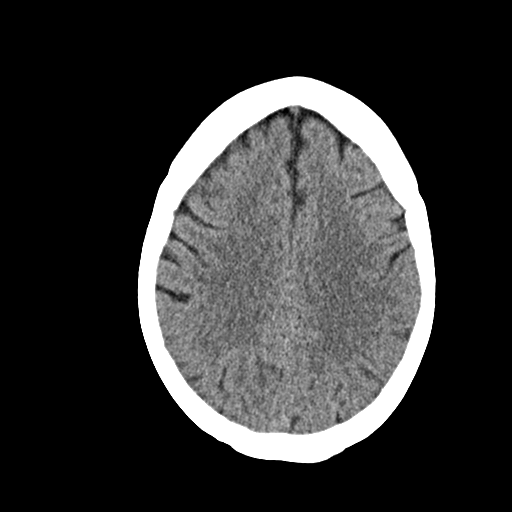
[im 19/30  bone]
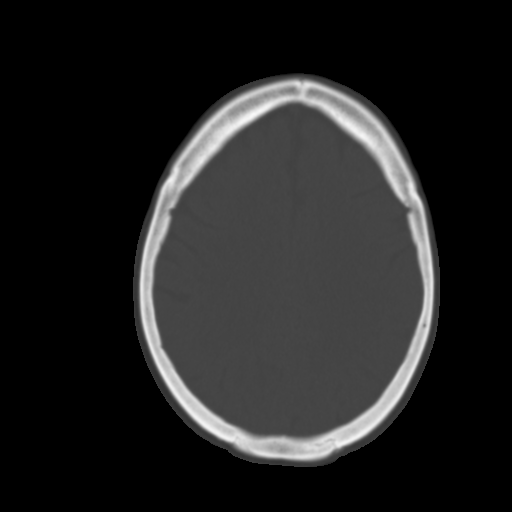
[im 22/30  brain]
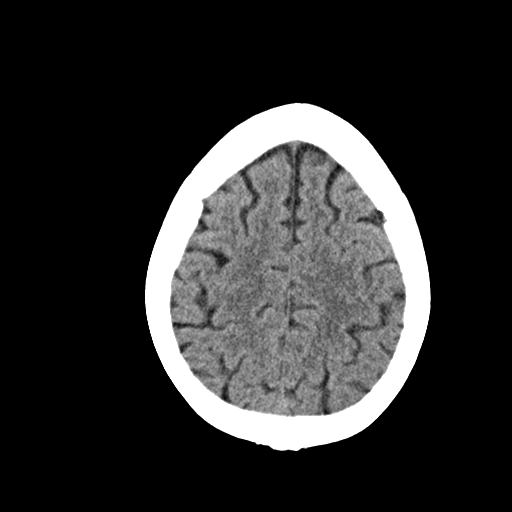
[im 26/30  brain]
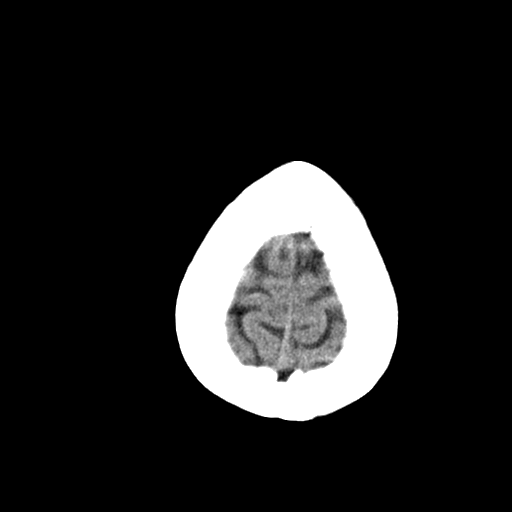

[Series 3: head bone · axial · 0.44mm/px · z∈[-133,-22]mm · 8 of 60 slices shown]
[im 7/60  bone]
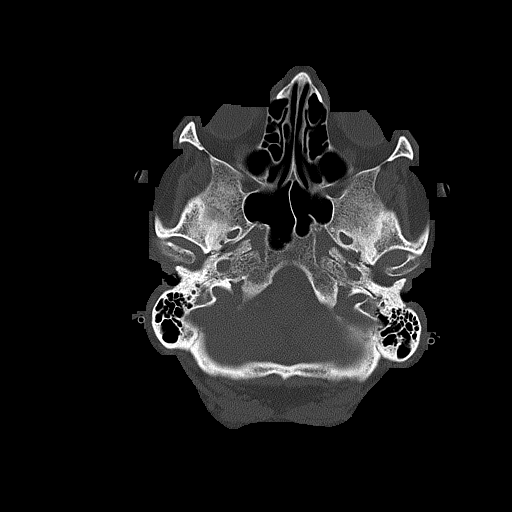
[im 14/60  bone]
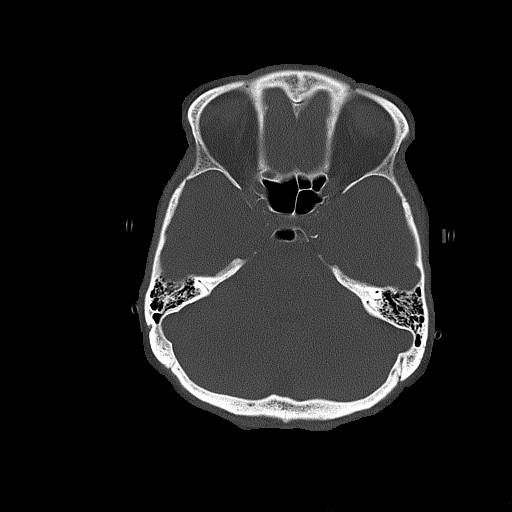
[im 20/60  bone]
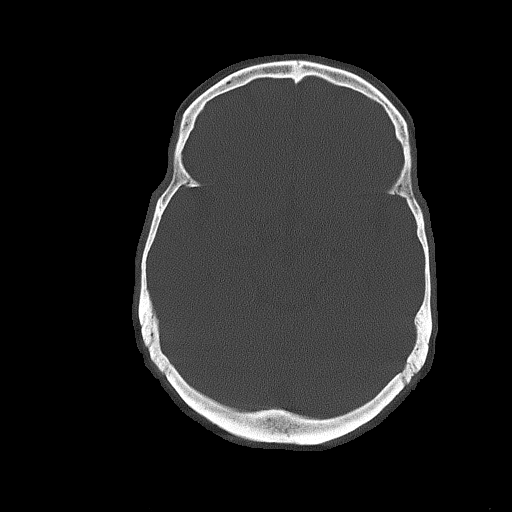
[im 27/60  bone]
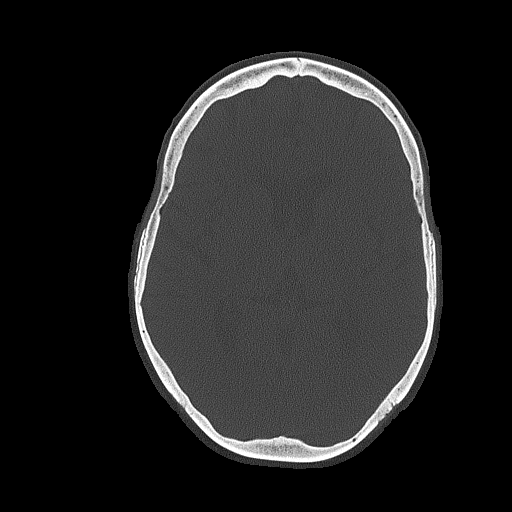
[im 33/60  bone]
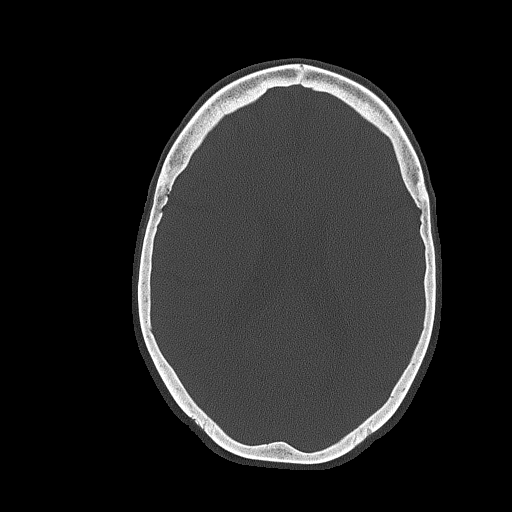
[im 40/60  bone]
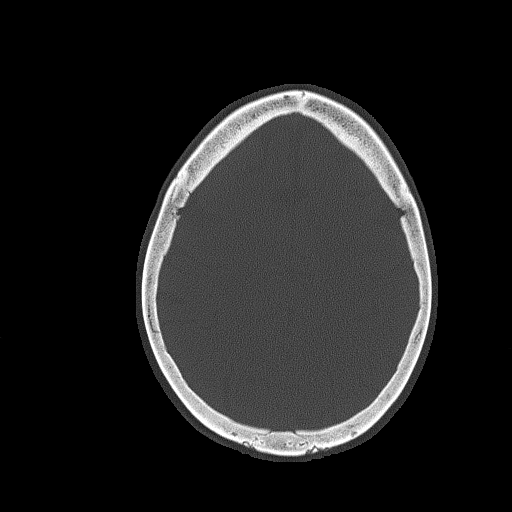
[im 46/60  bone]
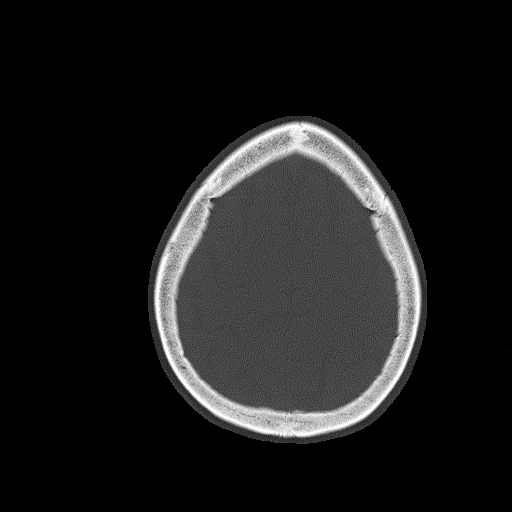
[im 53/60  bone]
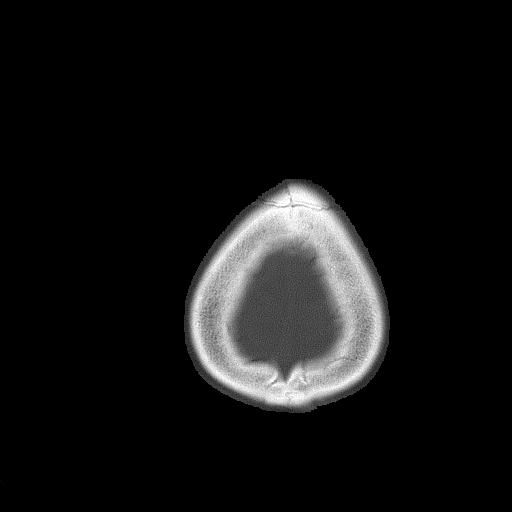

[15 of 30 positions shown; findings below may reference images not displayed]

FINDINGS: Evaluation of this exam is limited due to motion artifact.

The ventricles and sulci are appropriate in size for patient's age.
Minimal periventricular and deep white matter hypodensities
represent chronic microvascular ischemic changes. There is no
intracranial hemorrhage. No mass effect or midline shift identified.

The visualized paranasal sinuses and mastoid air cells are well
aerated. The calvarium is intact.
IMPRESSION: No acute intracranial pathology.

## 2017-07-11 IMAGING — DX DG HIP (WITH OR WITHOUT PELVIS) 2-3V*R*
3 series · 3 of 3 positions shown · non-contrast
Comparison: Radiograph dated 07/27/2014 and 07/26/2014

CLINICAL DATA: 64-year-old female with fall and right hip pain.

EXAM:
DG HIP (WITH OR WITHOUT PELVIS) 2-3V RIGHT

[pelvis ap]
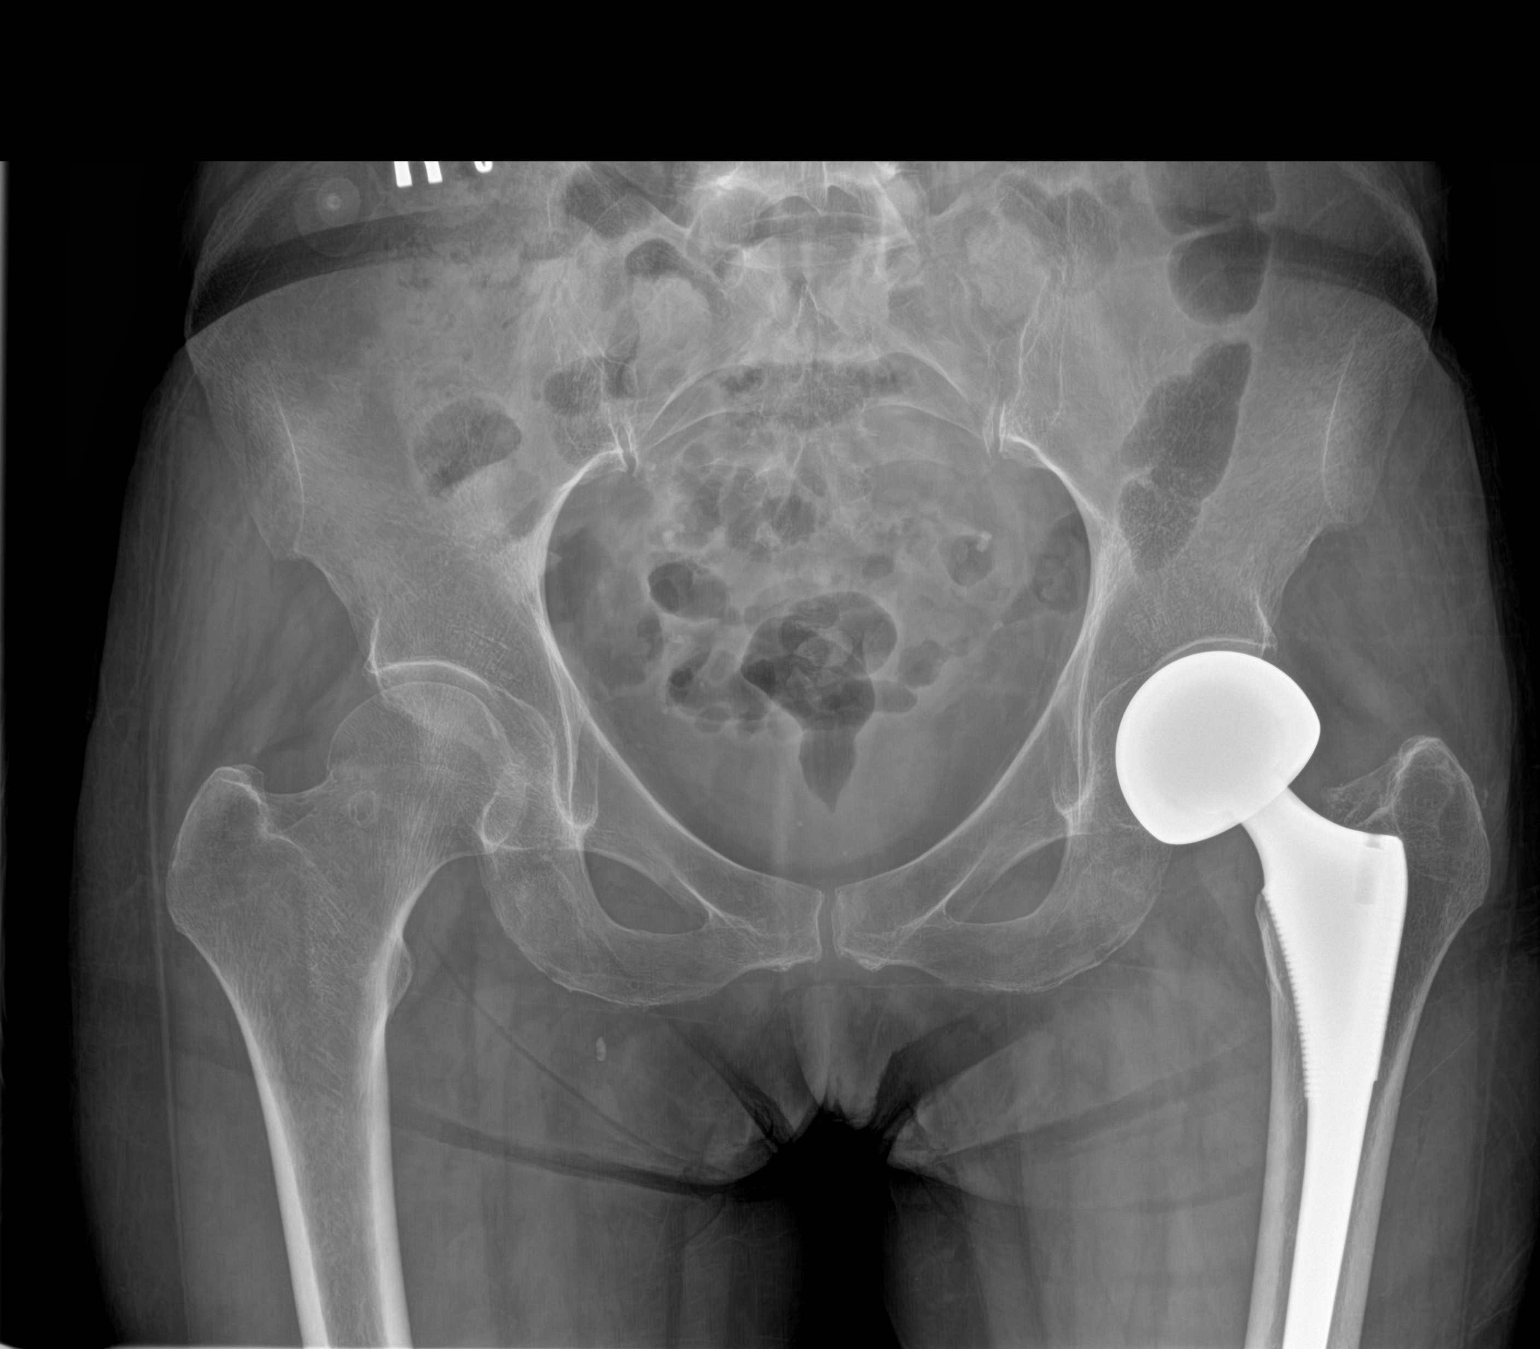

[hip ap]
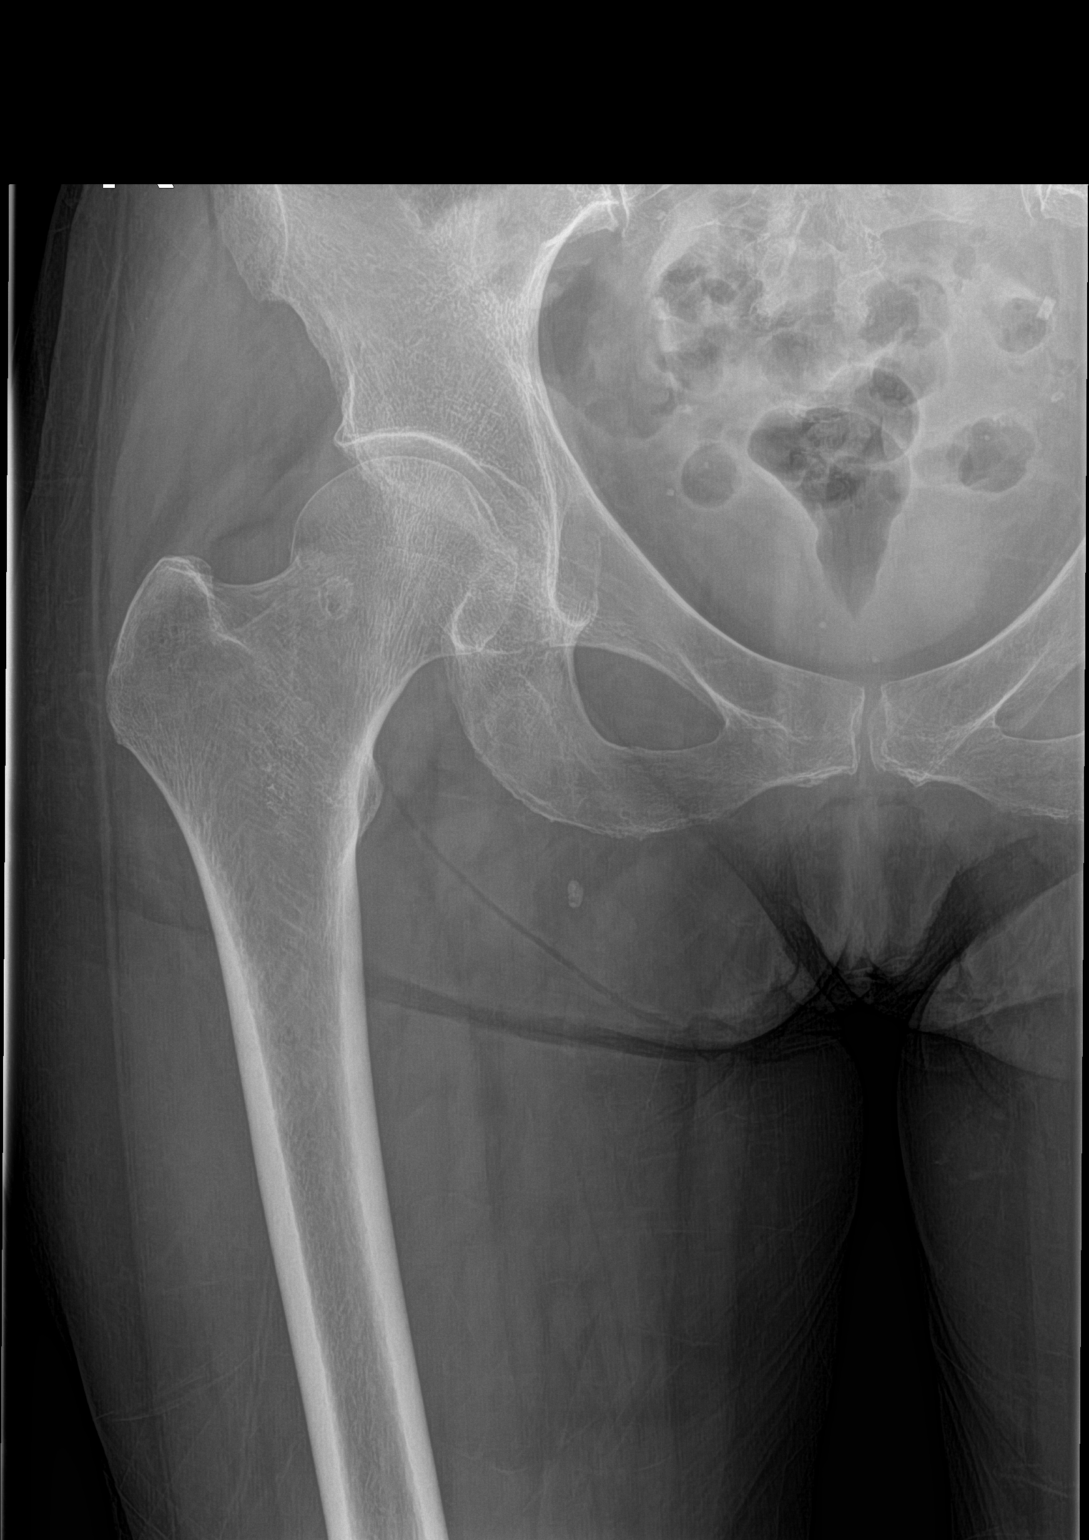

[hip lat]
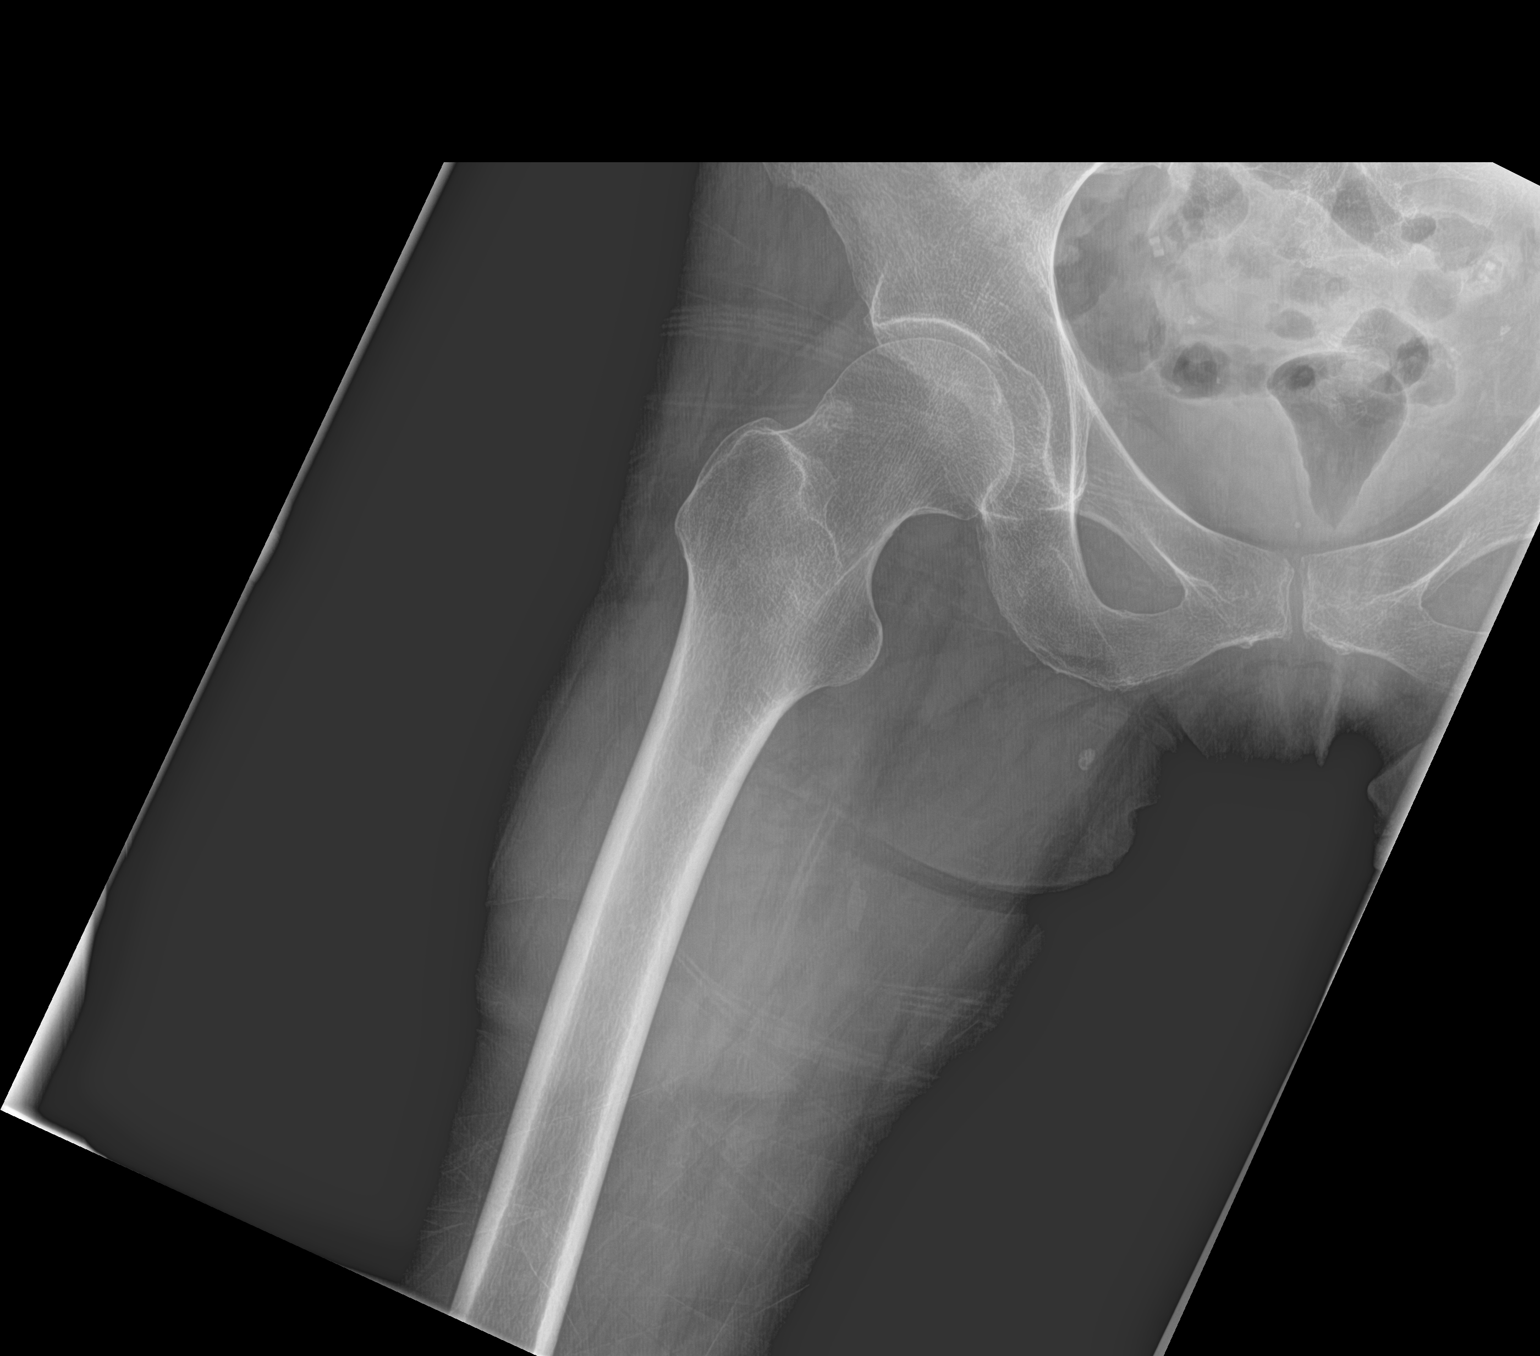

[3 of 3 positions shown; findings below may reference images not displayed]

FINDINGS: There is a total left hip arthroplasty. There is no acute fracture
or dislocation. Caps the bones are mildly osteopenic. The soft
tissues are grossly unremarkable.
IMPRESSION: No acute fracture or dislocation.

## 2019-12-17 DEATH — deceased
